# Patient Record
Sex: Male | Born: 1980 | Race: Black or African American | Hispanic: No | Marital: Single | Smoking: Current every day smoker
Health system: Southern US, Community
[De-identification: ages and names within clinical notes are randomized; demographics above are authoritative.]

## PROBLEM LIST (undated history)

## (undated) HISTORY — PX: NO PAST SURGERIES: SHX2092

---

## 2014-03-27 ENCOUNTER — Emergency Department (HOSPITAL_COMMUNITY)
Admission: EM | Admit: 2014-03-27 | Discharge: 2014-03-27 | Disposition: A | Discharge status: Home / Self Care | Payer: No Typology Code available for payment source | Attending: Emergency Medicine | Admitting: Emergency Medicine

## 2014-03-27 ENCOUNTER — Encounter (HOSPITAL_COMMUNITY): Payer: Self-pay | Admitting: Emergency Medicine

## 2014-03-27 ENCOUNTER — Emergency Department (HOSPITAL_COMMUNITY): Payer: No Typology Code available for payment source

## 2014-03-27 DIAGNOSIS — S0990XA Unspecified injury of head, initial encounter: Secondary | ICD-10-CM | POA: Insufficient documentation

## 2014-03-27 DIAGNOSIS — Z720 Tobacco use: Secondary | ICD-10-CM | POA: Insufficient documentation

## 2014-03-27 DIAGNOSIS — S299XXA Unspecified injury of thorax, initial encounter: Secondary | ICD-10-CM | POA: Diagnosis present

## 2014-03-27 DIAGNOSIS — M5489 Other dorsalgia: Secondary | ICD-10-CM

## 2014-03-27 DIAGNOSIS — Y9389 Activity, other specified: Secondary | ICD-10-CM | POA: Insufficient documentation

## 2014-03-27 DIAGNOSIS — Y92410 Unspecified street and highway as the place of occurrence of the external cause: Secondary | ICD-10-CM | POA: Diagnosis not present

## 2014-03-27 MED ORDER — IBUPROFEN 800 MG PO TABS: 800.0000 mg | ORAL_TABLET | Freq: Three times a day (TID) | ORAL | Status: DC

## 2014-03-27 MED ORDER — ACETAMINOPHEN 325 MG PO TABS
650.0000 mg | ORAL_TABLET | Freq: Once | ORAL | Status: AC
  Administered 2014-03-27: 650 mg via ORAL
  Filled 2014-03-27: qty 2
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
## 2017-02-17 ENCOUNTER — Encounter (HOSPITAL_BASED_OUTPATIENT_CLINIC_OR_DEPARTMENT_OTHER): Payer: Self-pay | Admitting: *Deleted

## 2017-02-17 ENCOUNTER — Emergency Department (HOSPITAL_BASED_OUTPATIENT_CLINIC_OR_DEPARTMENT_OTHER)
Admission: EM | Admit: 2017-02-17 | Discharge: 2017-02-18 | Disposition: A | Discharge status: Home / Self Care | Payer: No Typology Code available for payment source | Attending: Emergency Medicine | Admitting: Emergency Medicine

## 2017-02-17 DIAGNOSIS — Y9389 Activity, other specified: Secondary | ICD-10-CM | POA: Diagnosis not present

## 2017-02-17 DIAGNOSIS — Y92410 Unspecified street and highway as the place of occurrence of the external cause: Secondary | ICD-10-CM | POA: Insufficient documentation

## 2017-02-17 DIAGNOSIS — Y998 Other external cause status: Secondary | ICD-10-CM | POA: Diagnosis not present

## 2017-02-17 DIAGNOSIS — M545 Low back pain, unspecified: Secondary | ICD-10-CM

## 2017-02-17 DIAGNOSIS — M436 Torticollis: Secondary | ICD-10-CM | POA: Diagnosis not present

## 2017-02-17 MED ORDER — CYCLOBENZAPRINE HCL 10 MG PO TABS: 10.0000 mg | ORAL_TABLET | Freq: Two times a day (BID) | ORAL | 0 refills | Status: DC | PRN
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
## 2017-08-11 ENCOUNTER — Encounter (HOSPITAL_BASED_OUTPATIENT_CLINIC_OR_DEPARTMENT_OTHER): Payer: Self-pay | Admitting: Emergency Medicine

## 2017-08-11 ENCOUNTER — Emergency Department (HOSPITAL_BASED_OUTPATIENT_CLINIC_OR_DEPARTMENT_OTHER): Payer: BLUE CROSS/BLUE SHIELD

## 2017-08-11 ENCOUNTER — Other Ambulatory Visit: Payer: Self-pay

## 2017-08-11 ENCOUNTER — Emergency Department (HOSPITAL_BASED_OUTPATIENT_CLINIC_OR_DEPARTMENT_OTHER)
Admission: EM | Admit: 2017-08-11 | Discharge: 2017-08-11 | Disposition: A | Discharge status: Home / Self Care | Payer: BLUE CROSS/BLUE SHIELD | Attending: Emergency Medicine | Admitting: Emergency Medicine

## 2017-08-11 DIAGNOSIS — S8391XA Sprain of unspecified site of right knee, initial encounter: Secondary | ICD-10-CM | POA: Diagnosis not present

## 2017-08-11 DIAGNOSIS — Y929 Unspecified place or not applicable: Secondary | ICD-10-CM | POA: Diagnosis not present

## 2017-08-11 DIAGNOSIS — F17210 Nicotine dependence, cigarettes, uncomplicated: Secondary | ICD-10-CM | POA: Insufficient documentation

## 2017-08-11 DIAGNOSIS — S8991XA Unspecified injury of right lower leg, initial encounter: Secondary | ICD-10-CM | POA: Diagnosis present

## 2017-08-11 DIAGNOSIS — Y9367 Activity, basketball: Secondary | ICD-10-CM | POA: Diagnosis not present

## 2017-08-11 DIAGNOSIS — S86911A Strain of unspecified muscle(s) and tendon(s) at lower leg level, right leg, initial encounter: Secondary | ICD-10-CM

## 2017-08-11 DIAGNOSIS — Y998 Other external cause status: Secondary | ICD-10-CM | POA: Diagnosis not present

## 2017-08-11 DIAGNOSIS — Y33XXXA Other specified events, undetermined intent, initial encounter: Secondary | ICD-10-CM | POA: Insufficient documentation

## 2017-08-11 MED ORDER — IBUPROFEN 800 MG PO TABS: 800.0000 mg | ORAL_TABLET | Freq: Three times a day (TID) | ORAL | 0 refills | Status: DC

## 2017-08-11 MED ORDER — IBUPROFEN 800 MG PO TABS
800.0000 mg | ORAL_TABLET | Freq: Once | ORAL | Status: AC
  Administered 2017-08-11: 800 mg via ORAL
  Filled 2017-08-11: qty 1

## 2017-08-11 NOTE — ED Notes (Signed)
ED Provider at bedside. 

## 2017-08-11 NOTE — ED Triage Notes (Signed)
Pain to right knee x2 days since playing basketball with son.  "I came down on it wrong."

## 2017-08-11 NOTE — ED Provider Notes (Signed)
[facility] EMERGENCY DEPARTMENT Provider Note   CSN: [id number] Arrival date & time: 08/11/17  [id number]     History   Chief Complaint Chief Complaint  Patient presents with  . Knee Pain    HPI Benjamin Cameron is a 37 y.o. male.  Pt presents to the ED today with right knee pain.  He said he hurt it 2 days ago while playing basketball with his son.  He is not exactly sure what he did.  He is able to walk on it.  It was hurting last night at work and so he came in this morning when he got off.     History reviewed. No pertinent past medical history.  There are no active problems to display for this patient.   History reviewed. No pertinent surgical history.     Home Medications    Prior to Admission medications   Medication Sig Start Date End Date Taking? Authorizing Provider  cyclobenzaprine (FLEXERIL) 10 MG tablet Take 1 tablet (10 mg total) by mouth 2 (two) times daily as needed for muscle spasms. 02/17/17   Kellie ShropshireShrosbree, Emily J, PA-C  ibuprofen (ADVIL,MOTRIN) 800 MG tablet Take 1 tablet (800 mg total) by mouth 3 (three) times daily. 08/11/17   Jacalyn LefevreHaviland, Jamyiah Labella, MD    Family History No family history on file.  Social History Social History   Tobacco Use  . Smoking status: Current Every Day Smoker    Packs/day: 0.50    Types: Cigarettes  . Smokeless tobacco: Never Used  Substance Use Topics  . Alcohol use: Yes    Comment: occ  . Drug use: No     Allergies   Patient has no known allergies.   Review of Systems Review of Systems  Musculoskeletal:       Knee pain  All other systems reviewed and are negative.    Physical Exam Updated Vital Signs BP (!) 142/89 (BP Location: Right Arm)   Pulse 90   Temp 98.7 F (37.1 C) (Oral)   Resp 16   Ht 6\' 2"  (1.88 m)   Wt 104.3 kg (230 lb)   SpO2 98%   BMI 29.53 kg/m   Physical Exam  Constitutional: He is oriented to person, place, and time. He appears well-developed and well-nourished.  HENT:    Head: Normocephalic and atraumatic.  Right Ear: External ear normal.  Left Ear: External ear normal.  Nose: Nose normal.  Mouth/Throat: Oropharynx is clear and moist.  Eyes: Pupils are equal, round, and reactive to light. Conjunctivae and EOM are normal.  Neck: Normal range of motion. Neck supple.  Cardiovascular: Normal rate, regular rhythm, normal heart sounds and intact distal pulses.  Pulmonary/Chest: Effort normal and breath sounds normal.  Abdominal: Soft. Bowel sounds are normal.  Musculoskeletal:       Right knee: He exhibits swelling. Tenderness found. Lateral joint line tenderness noted.  Neurological: He is alert and oriented to person, place, and time.  Skin: Skin is warm. Capillary refill takes less than 2 seconds.  Psychiatric: He has a normal mood and affect. His behavior is normal. Judgment and thought content normal.  Nursing note and vitals reviewed.    ED Treatments / Results  Labs (all labs ordered are listed, but only abnormal results are displayed) Labs Reviewed - No data to display  EKG  EKG Interpretation None       Radiology Dg Knee Complete 4 Views Right  Result Date: 08/11/2017 CLINICAL DATA:  Lateral knee pain following playing  basketball, initial encounter EXAM: RIGHT KNEE - COMPLETE 4+ VIEW COMPARISON:  None. FINDINGS: No evidence of fracture, dislocation, or joint effusion. No evidence of arthropathy or other focal bone abnormality. Soft tissues are unremarkable. IMPRESSION: No acute abnormality noted. Electronically Signed   By: Alcide Clever M.D.   On: 08/11/2017 08:20    Procedures Procedures (including critical care time)  Medications Ordered in ED Medications  ibuprofen (ADVIL,MOTRIN) tablet 800 mg (800 mg Oral Given 08/11/17 0745)     Initial Impression / Assessment and Plan / ED Course  I have reviewed the triage vital signs and the nursing notes.  Pertinent labs & imaging results that were available during my care of the patient  were reviewed by me and considered in my medical decision making (see chart for details).     Knee xray ok.  Pt instructed to f/u with sports medicine.  Return if worse.    Final Clinical Impressions(s) / ED Diagnoses   Final diagnoses:  Strain of right knee, initial encounter    ED Discharge Orders        Ordered    ibuprofen (ADVIL,MOTRIN) 800 MG tablet  3 times daily     08/11/17 1610       Jacalyn Lefevre, MD 08/11/17 [phone]

## 2017-08-11 NOTE — ED Notes (Signed)
Patient transported to X-ray 

## 2017-08-11 NOTE — ED Notes (Signed)
ED Provider at bedside discussing test results and dispo plan of care. 

## 2018-02-28 ENCOUNTER — Emergency Department (HOSPITAL_BASED_OUTPATIENT_CLINIC_OR_DEPARTMENT_OTHER)
Admission: EM | Admit: 2018-02-28 | Discharge: 2018-02-28 | Disposition: A | Discharge status: Home / Self Care | Payer: BLUE CROSS/BLUE SHIELD | Attending: Emergency Medicine | Admitting: Emergency Medicine

## 2018-02-28 ENCOUNTER — Emergency Department (HOSPITAL_BASED_OUTPATIENT_CLINIC_OR_DEPARTMENT_OTHER): Payer: BLUE CROSS/BLUE SHIELD

## 2018-02-28 ENCOUNTER — Other Ambulatory Visit: Payer: Self-pay

## 2018-02-28 ENCOUNTER — Encounter (HOSPITAL_BASED_OUTPATIENT_CLINIC_OR_DEPARTMENT_OTHER): Payer: Self-pay

## 2018-02-28 DIAGNOSIS — K921 Melena: Secondary | ICD-10-CM | POA: Insufficient documentation

## 2018-02-28 DIAGNOSIS — R0789 Other chest pain: Secondary | ICD-10-CM | POA: Diagnosis present

## 2018-02-28 DIAGNOSIS — F17210 Nicotine dependence, cigarettes, uncomplicated: Secondary | ICD-10-CM | POA: Insufficient documentation

## 2018-02-28 LAB — CBC WITH DIFFERENTIAL/PLATELET
ABS IMMATURE GRANULOCYTES: 0 10*3/uL (ref 0.00–0.07)
Basophils Absolute: 0 10*3/uL (ref 0.0–0.1)
Basophils Relative: 1 %
Eosinophils Absolute: 0.3 10*3/uL (ref 0.0–0.5)
Eosinophils Relative: 7 %
HCT: 41.2 % (ref 39.0–52.0)
Hemoglobin: 13.5 g/dL (ref 13.0–17.0)
Immature Granulocytes: 0 %
Lymphocytes Relative: 35 %
Lymphs Abs: 1.8 10*3/uL (ref 0.7–4.0)
MCH: 30.6 pg (ref 26.0–34.0)
MCHC: 32.8 g/dL (ref 30.0–36.0)
MCV: 93.4 fL (ref 80.0–100.0)
MONO ABS: 0.6 10*3/uL (ref 0.1–1.0)
MONOS PCT: 13 %
NEUTROS ABS: 2.3 10*3/uL (ref 1.7–7.7)
Neutrophils Relative %: 44 %
Platelets: 264 10*3/uL (ref 150–400)
RBC: 4.41 MIL/uL (ref 4.22–5.81)
RDW: 13.5 % (ref 11.5–15.5)
WBC: 5.1 10*3/uL (ref 4.0–10.5)
nRBC: 0 % (ref 0.0–0.2)

## 2018-02-28 LAB — COMPREHENSIVE METABOLIC PANEL
ALBUMIN: 4.3 g/dL (ref 3.5–5.0)
ALT: 41 U/L (ref 0–44)
AST: 36 U/L (ref 15–41)
Alkaline Phosphatase: 67 U/L (ref 38–126)
Anion gap: 8 (ref 5–15)
BUN: 10 mg/dL (ref 6–20)
CHLORIDE: 103 mmol/L (ref 98–111)
CO2: 27 mmol/L (ref 22–32)
Calcium: 9.4 mg/dL (ref 8.9–10.3)
Creatinine, Ser: 1.02 mg/dL (ref 0.61–1.24)
GFR calc Af Amer: >60 mL/min (ref >60)
GFR calc non Af Amer: >60 mL/min (ref >60)
Glucose, Bld: 88 mg/dL (ref 70–99)
Potassium: 3.9 mmol/L (ref 3.5–5.1)
Sodium: 138 mmol/L (ref 135–145)
Total Bilirubin: 0.8 mg/dL (ref 0.3–1.2)
Total Protein: 7.7 g/dL (ref 6.5–8.1)

## 2018-02-28 LAB — TROPONIN I: Troponin I: <0.03 ng/mL (ref <0.03)

## 2018-02-28 NOTE — ED Triage Notes (Signed)
C/o CP x month, bloody stools x 2-3 weeks-has not sought medical attention for either c/o-NAD-steady gait

## 2018-02-28 NOTE — ED Provider Notes (Signed)
[facility] EMERGENCY DEPARTMENT Provider Note   CSN: [id number] Arrival date & time: 02/28/18  1415     History   Chief Complaint Chief Complaint  Patient presents with  . Chest Pain    HPI Benjamin Cameron is a 37 y.o. male.  The history is provided by the patient. No language interpreter was used.  Chest Pain     Benjamin Cameron is a 37 y.o. male who presents to the Emergency Department complaining of chest tightness, rectal bleeding. He presents to the emergency department for evaluation of two complaints. He reports chest tightness that is been present for the last few weeks. Symptoms were initially intermittent in nature and now they are constant. There are no clear alleviating or worsening factors. He denies any fevers, shortness of breath, abdominal pain, nausea, vomiting. He does have associated lightheadedness intermittently over the last few weeks. Lightheadedness comes and goes with no clear alleviating or worsening factors. He also complains of bright red blood per rectum for the last several weeks. He has 1 to 2 bowel movements daily with bright red blood and occasional clots mixed with stool. He has no pain with bowel movements. No prior similar symptoms. He has no medical problems and takes no medications. He smokes tobacco. Occasional alcohol use, no street drugs. He is a family history of hypertension, diabetes and cardiac disease. No history of bleeding disorders. He does not have a PCP. History reviewed. No pertinent past medical history.  There are no active problems to display for this patient.   History reviewed. No pertinent surgical history.      Home Medications    Prior to Admission medications   Not on File    Family History No family history on file.  Social History Social History   Tobacco Use  . Smoking status: Current Every Day Smoker    Packs/day: 0.50    Types: Cigarettes  . Smokeless tobacco: Never Used  Substance Use  Topics  . Alcohol use: Yes    Comment: occ  . Drug use: No     Allergies   Patient has no known allergies.   Review of Systems Review of Systems  Cardiovascular: Positive for chest pain.  All other systems reviewed and are negative.    Physical Exam Updated Vital Signs BP (!) 146/105 (BP Location: Left Arm)   Pulse 82   Temp 98.5 F (36.9 C) (Oral)   Resp 18   SpO2 100%   Physical Exam  Constitutional: He is oriented to person, place, and time. He appears well-developed and well-nourished.  HENT:  Head: Normocephalic and atraumatic.  Cardiovascular: Normal rate and regular rhythm.  No murmur heard. Pulmonary/Chest: Effort normal and breath sounds normal. No respiratory distress.  Abdominal: Soft. There is no tenderness. There is no rebound and no guarding.  Genitourinary:  Genitourinary Comments: Rectal exam with no external hemorrhoids. No palpable masses on DRE. No gross blood.  Musculoskeletal: He exhibits no edema or tenderness.  Neurological: He is alert and oriented to person, place, and time.  Skin: Skin is warm and dry.  Psychiatric: He has a normal mood and affect. His behavior is normal.  Nursing note and vitals reviewed.    ED Treatments / Results  Labs (all labs ordered are listed, but only abnormal results are displayed) Labs Reviewed  COMPREHENSIVE METABOLIC PANEL  CBC WITH DIFFERENTIAL/PLATELET  TROPONIN I    EKG EKG Interpretation  Date/Time:  Wednesday February 28 2018 14:27:48 EDT Ventricular Rate:  72 PR Interval:  174 QRS Duration: 96 QT Interval:  376 QTC Calculation: 411 R Axis:   25 Text Interpretation:  Normal sinus rhythm Normal ECG No prior ECG for comparison.  Appears to show early repol in lead 1,AVL and V2. No STEMI Confirmed by Theda Belfast ([id number]) on 02/28/2018 2:32:40 PM   Radiology Dg Chest 2 View  Result Date: 02/28/2018 CLINICAL DATA:  Chest tightness EXAM: CHEST - 2 VIEW COMPARISON:  None. FINDINGS: There is  atelectatic change in the left base. Lungs elsewhere are clear. Heart size and pulmonary vascularity are normal. No adenopathy. No bone lesions. No pneumothorax. IMPRESSION: Atelectasis left base.  Lungs elsewhere clear. Electronically Signed   By: Bretta Bang III M.D.   On: 02/28/2018 16:03    Procedures Procedures (including critical care time)  Medications Ordered in ED Medications - No data to display   Initial Impression / Assessment and Plan / ED Course  I have reviewed the triage vital signs and the nursing notes.  Pertinent labs & imaging results that were available during my care of the patient were reviewed by me and considered in my medical decision making (see chart for details).     Pt here for evaluation of chest pain and hematochezia. He is well appearing on examination and in no acute distress. No gross blood on examination in the ED. CBC within normal limits. Sensation is not consistent with G.I. hemorrhage, ACS, PE, dissection, pneumonia. Discussed with patient home care for chest tightness as well as hematochezia. Discussed importance of PCP as well as G.I. follow-up. Return precautions discussed.  Final Clinical Impressions(s) / ED Diagnoses   Final diagnoses:  Atypical chest pain  Hematochezia    ED Discharge Orders    None       Tilden Fossa, MD 02/28/18 1645

## 2019-03-12 IMAGING — CR DG CHEST 2V
2 series · 2 of 2 positions shown · non-contrast
Comparison: None.

CLINICAL DATA: Chest tightness

EXAM:
CHEST - 2 VIEW

[w chest pa]
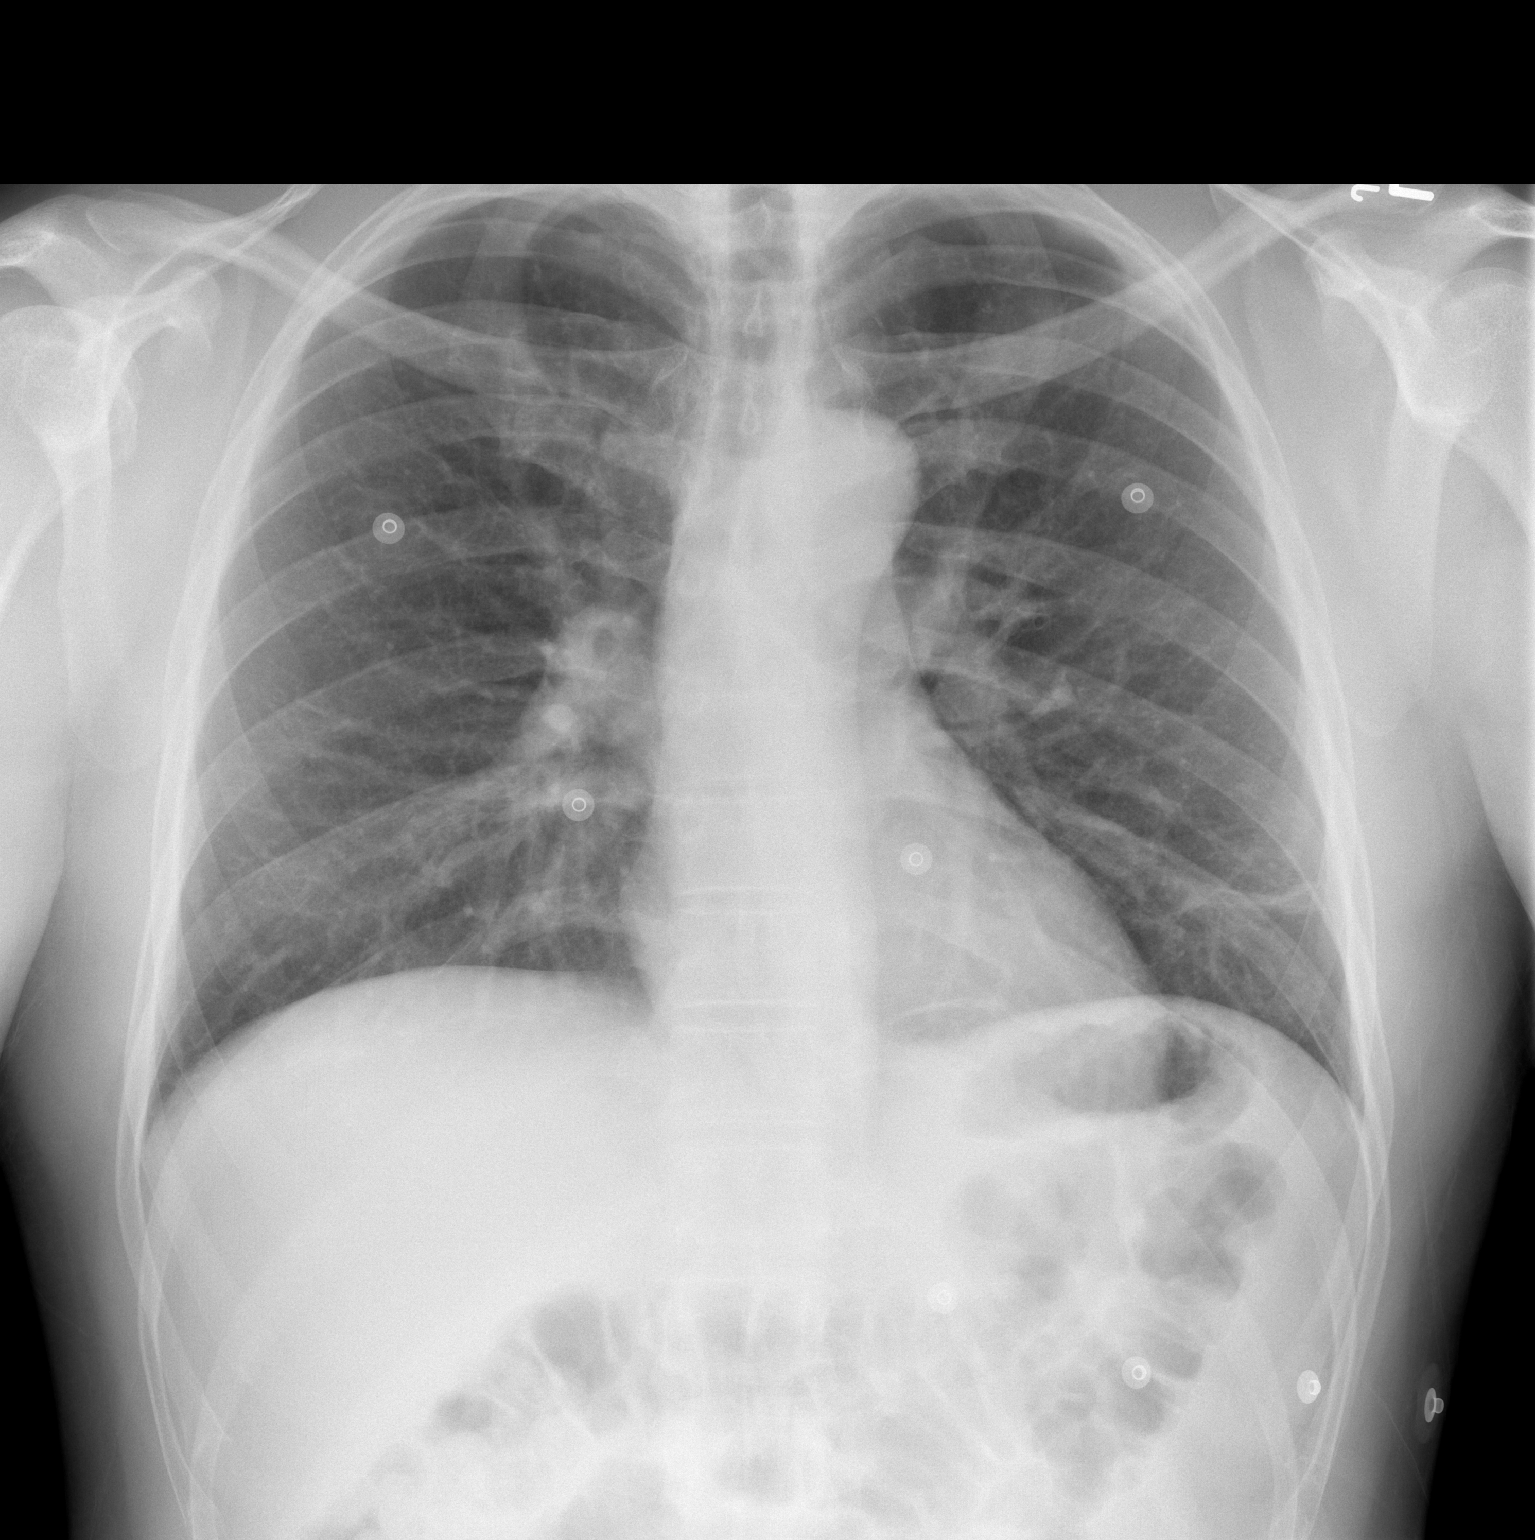

[w chest lat]
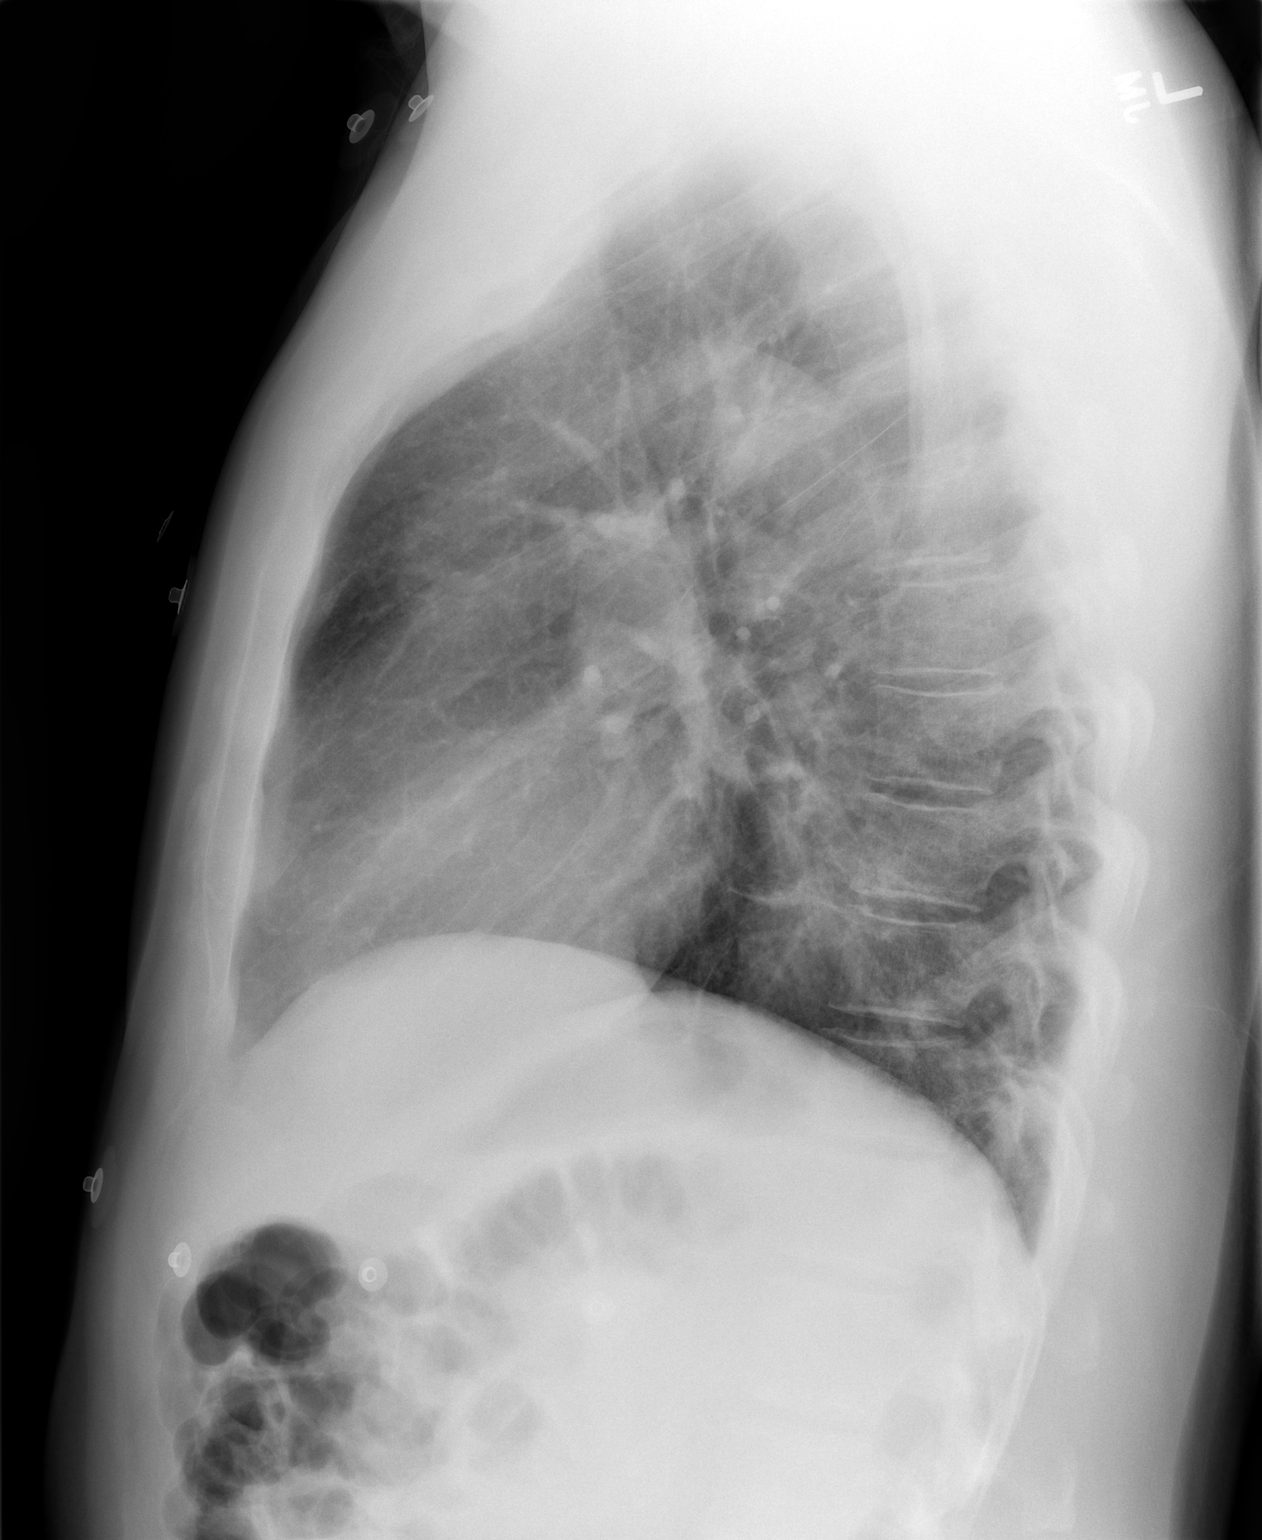

[2 of 2 positions shown; findings below may reference images not displayed]

FINDINGS: There is atelectatic change in the left base. Lungs elsewhere are
clear. Heart size and pulmonary vascularity are normal. No
adenopathy. No bone lesions. No pneumothorax.
IMPRESSION: Atelectasis left base.  Lungs elsewhere clear.

## 2020-05-25 ENCOUNTER — Emergency Department (HOSPITAL_COMMUNITY)
Admission: EM | Admit: 2020-05-25 | Discharge: 2020-05-25 | Disposition: A | Discharge status: Home / Self Care | Payer: Worker's Compensation | Attending: Emergency Medicine | Admitting: Emergency Medicine

## 2020-05-25 ENCOUNTER — Encounter (HOSPITAL_COMMUNITY): Payer: Self-pay

## 2020-05-25 ENCOUNTER — Emergency Department (HOSPITAL_COMMUNITY): Payer: Worker's Compensation

## 2020-05-25 ENCOUNTER — Other Ambulatory Visit: Payer: Self-pay

## 2020-05-25 DIAGNOSIS — F17210 Nicotine dependence, cigarettes, uncomplicated: Secondary | ICD-10-CM | POA: Insufficient documentation

## 2020-05-25 DIAGNOSIS — W19XXXA Unspecified fall, initial encounter: Secondary | ICD-10-CM | POA: Insufficient documentation

## 2020-05-25 DIAGNOSIS — S99912A Unspecified injury of left ankle, initial encounter: Secondary | ICD-10-CM | POA: Diagnosis present

## 2020-05-25 DIAGNOSIS — S82832A Other fracture of upper and lower end of left fibula, initial encounter for closed fracture: Secondary | ICD-10-CM | POA: Insufficient documentation

## 2020-05-25 DIAGNOSIS — Y990 Civilian activity done for income or pay: Secondary | ICD-10-CM | POA: Insufficient documentation

## 2020-05-25 NOTE — Discharge Instructions (Signed)
Use Tylenol, ice and ibuprofen as needed for pain. Follow-up with orthopedics for further recommendations.

## 2020-05-25 NOTE — ED Provider Notes (Signed)
[facility] EMERGENCY DEPARTMENT Provider Note   CSN: [id number] Arrival date & time: 05/25/20  1802     History Chief Complaint  Patient presents with  . Leg Injury    Benjamin Cameron is a 40 y.o. male.  Patient presents from urgent care for further evaluation treatment of left ankle injury.  Patient was at work and had an event that caused him to fall back injuring his left ankle.  Pain and swelling with walking.  No other injuries.  No weakness or numbness.        History reviewed. No pertinent past medical history.  There are no problems to display for this patient.   History reviewed. No pertinent surgical history.     History reviewed. No pertinent family history.  Social History   Tobacco Use  . Smoking status: Current Every Day Smoker    Packs/day: 0.50    Types: Cigarettes  . Smokeless tobacco: Never Used  Substance Use Topics  . Alcohol use: Yes    Comment: occ  . Drug use: No    Home Medications Prior to Admission medications   Not on File    Allergies    Patient has no known allergies.  Review of Systems   Review of Systems  Respiratory: Negative for shortness of breath.   Cardiovascular: Negative for chest pain.  Gastrointestinal: Negative for abdominal pain and vomiting.  Genitourinary: Negative for flank pain.  Musculoskeletal: Positive for gait problem and joint swelling. Negative for back pain, neck pain and neck stiffness.  Skin: Negative for rash.  Neurological: Negative for light-headedness and headaches.    Physical Exam Updated Vital Signs BP (!) 145/101 (BP Location: Right Arm)   Pulse 99   Temp 99.8 F (37.7 C) (Oral)   Resp 16   SpO2 100%   Physical Exam Vitals and nursing note reviewed.  Constitutional:      Appearance: He is well-developed and well-nourished.  HENT:     Head: Normocephalic and atraumatic.  Eyes:     General:        Right eye: No discharge.        Left eye: No discharge.      Conjunctiva/sclera: Conjunctivae normal.  Neck:     Trachea: No tracheal deviation.  Cardiovascular:     Rate and Rhythm: Normal rate and regular rhythm.     Heart sounds: No murmur heard.   Pulmonary:     Effort: Pulmonary effort is normal. No respiratory distress.     Breath sounds: Normal breath sounds.  Abdominal:     General: There is no distension.     Palpations: Abdomen is soft.     Tenderness: There is no abdominal tenderness. There is no guarding.  Musculoskeletal:        General: Swelling, tenderness and signs of injury present. No deformity or edema.     Cervical back: Normal range of motion and neck supple.     Comments: Patient has tenderness and swelling lateral malleolus on the left ankle, no deformity, no tenderness to left foot or proximal left leg or knee or hip.  Compartment soft, neurovascularly intact.  Skin:    General: Skin is warm and dry.     Findings: No rash.  Neurological:     Mental Status: He is alert and oriented to person, place, and time.  Psychiatric:        Mood and Affect: Mood and affect normal.     ED Results /  Procedures / Treatments   Labs (all labs ordered are listed, but only abnormal results are displayed) Labs Reviewed - No data to display  EKG None  Radiology DG Ankle Complete Left  Result Date: 05/25/2020 CLINICAL DATA:  Injury or work this morning. Reported distal fibular fracture. EXAM: LEFT ANKLE COMPLETE - 3+ VIEW COMPARISON:  None. FINDINGS: There is a fracture from the inferior margin of the distal fibula, without significant displacement. There is also a small fracture from the lateral margin of the distal fibula below the level of the ankle joint. No other defined fractures. The ankle joint is normally spaced and aligned. There is significant soft tissue swelling predominantly laterally. IMPRESSION: 1. Avulsion type fractures from the distal fibula as detailed. Normally aligned ankle joint. Electronically Signed   By:  Amie Portland M.D.   On: 05/25/2020 19:36    Procedures Procedures (including critical care time)  Medications Ordered in ED Medications - No data to display  ED Course  I have reviewed the triage vital signs and the nursing notes.  Pertinent labs & imaging results that were available during my care of the patient were reviewed by me and considered in my medical decision making (see chart for details).    MDM Rules/Calculators/A&P                          Patient presents after injury at work.  Reviewed x-ray results showing distal left fibula fracture comminuted however unable to visualize images. X-ray ordered to ensure mortise intact, x-rays reviewed in the ER showing avulsion type fracture distal fibula with normally aligned joint. Discussed with orthopedic technician for Cam walker boot and outpatient follow-up discussed with orthopedics.  Final Clinical Impression(s) / ED Diagnoses Final diagnoses:  Closed avulsion fracture of distal end of left fibula, initial encounter    Rx / DC Orders ED Discharge Orders    None       Blane Ohara, MD 05/25/20 2009

## 2020-05-25 NOTE — Progress Notes (Signed)
Orthopedic Tech Progress Note Patient Details:  Benjamin Cameron 1980-12-24 [id number]  Ortho Devices Type of Ortho Device: CAM walker Ortho Device/Splint Location: lle Ortho Device/Splint Interventions: Ordered,Application,Adjustment   Post Interventions Patient Tolerated: Well Instructions Provided: Care of device,Adjustment of device   Trinna Post 05/25/2020, 8:47 PM

## 2020-05-25 NOTE — ED Triage Notes (Signed)
Pt seen at [facility] for a work related Left leg injury approx 10 am. Recent XR shows Left distal fibula fx, comminuted displaced fx.   They sent him here for further treatment.

## 2021-06-06 IMAGING — DX DG ANKLE COMPLETE 3+V*L*
3 series · 3 of 3 positions shown · non-contrast
Comparison: None.

CLINICAL DATA: Injury or work this morning. Reported distal fibular
fracture.

EXAM:
LEFT ANKLE COMPLETE - 3+ VIEW

[ankle ap]
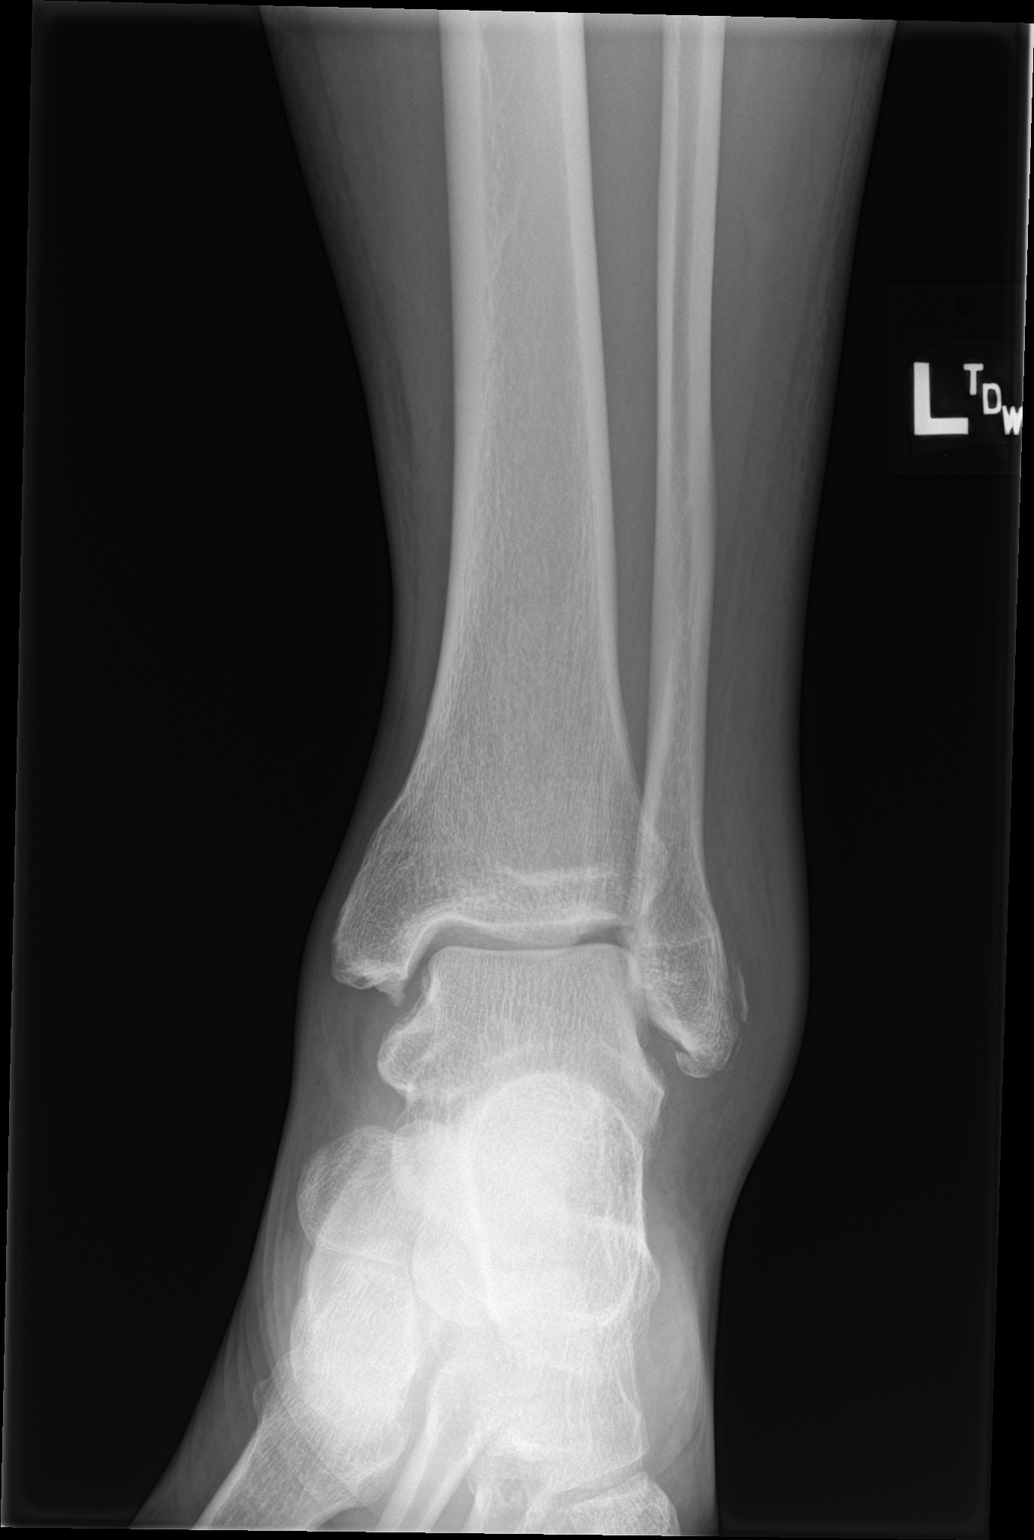

[ankle obl]
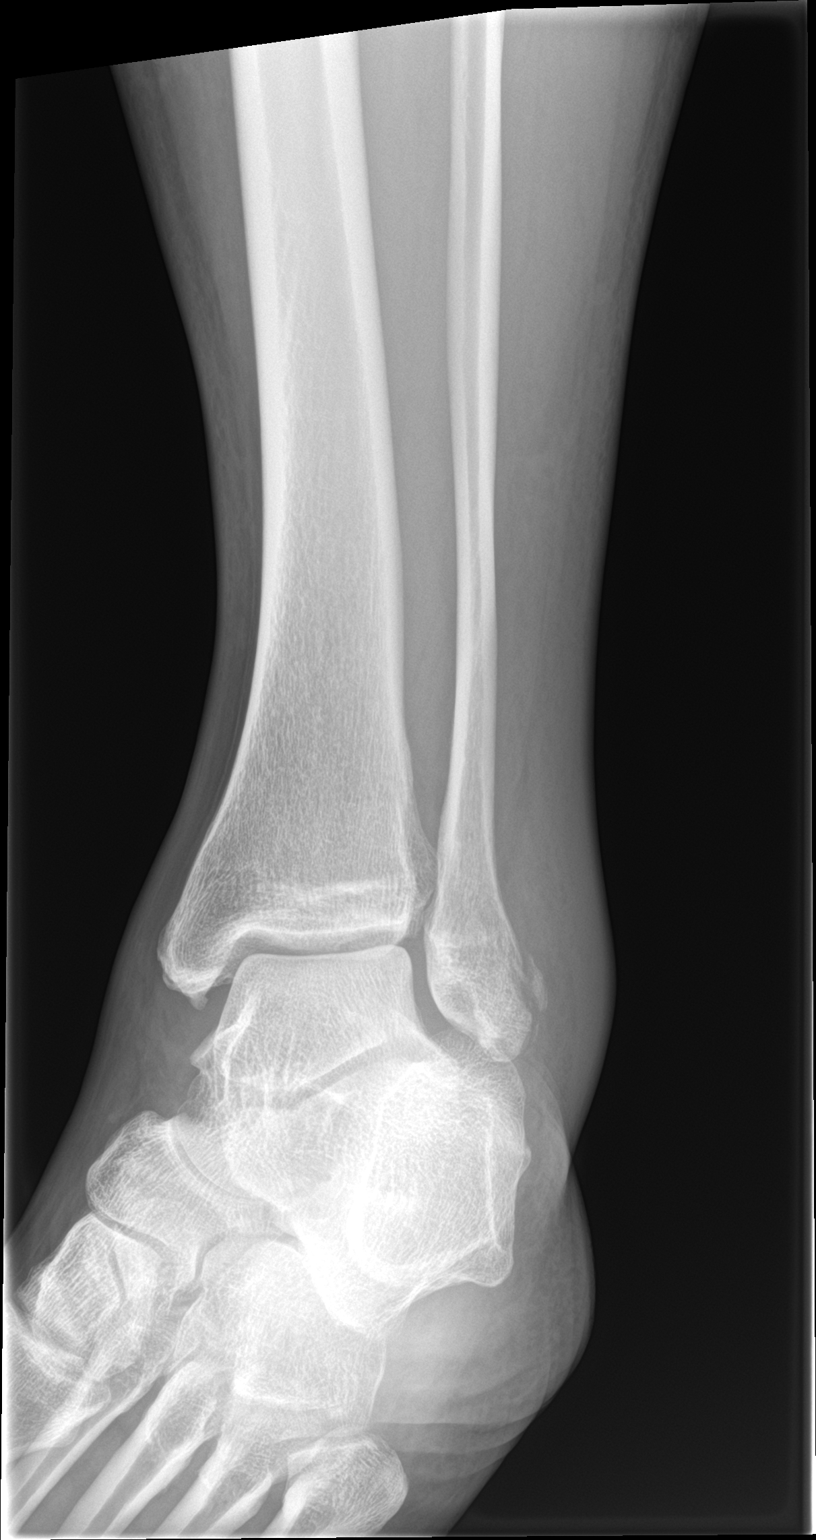

[ankle lat]
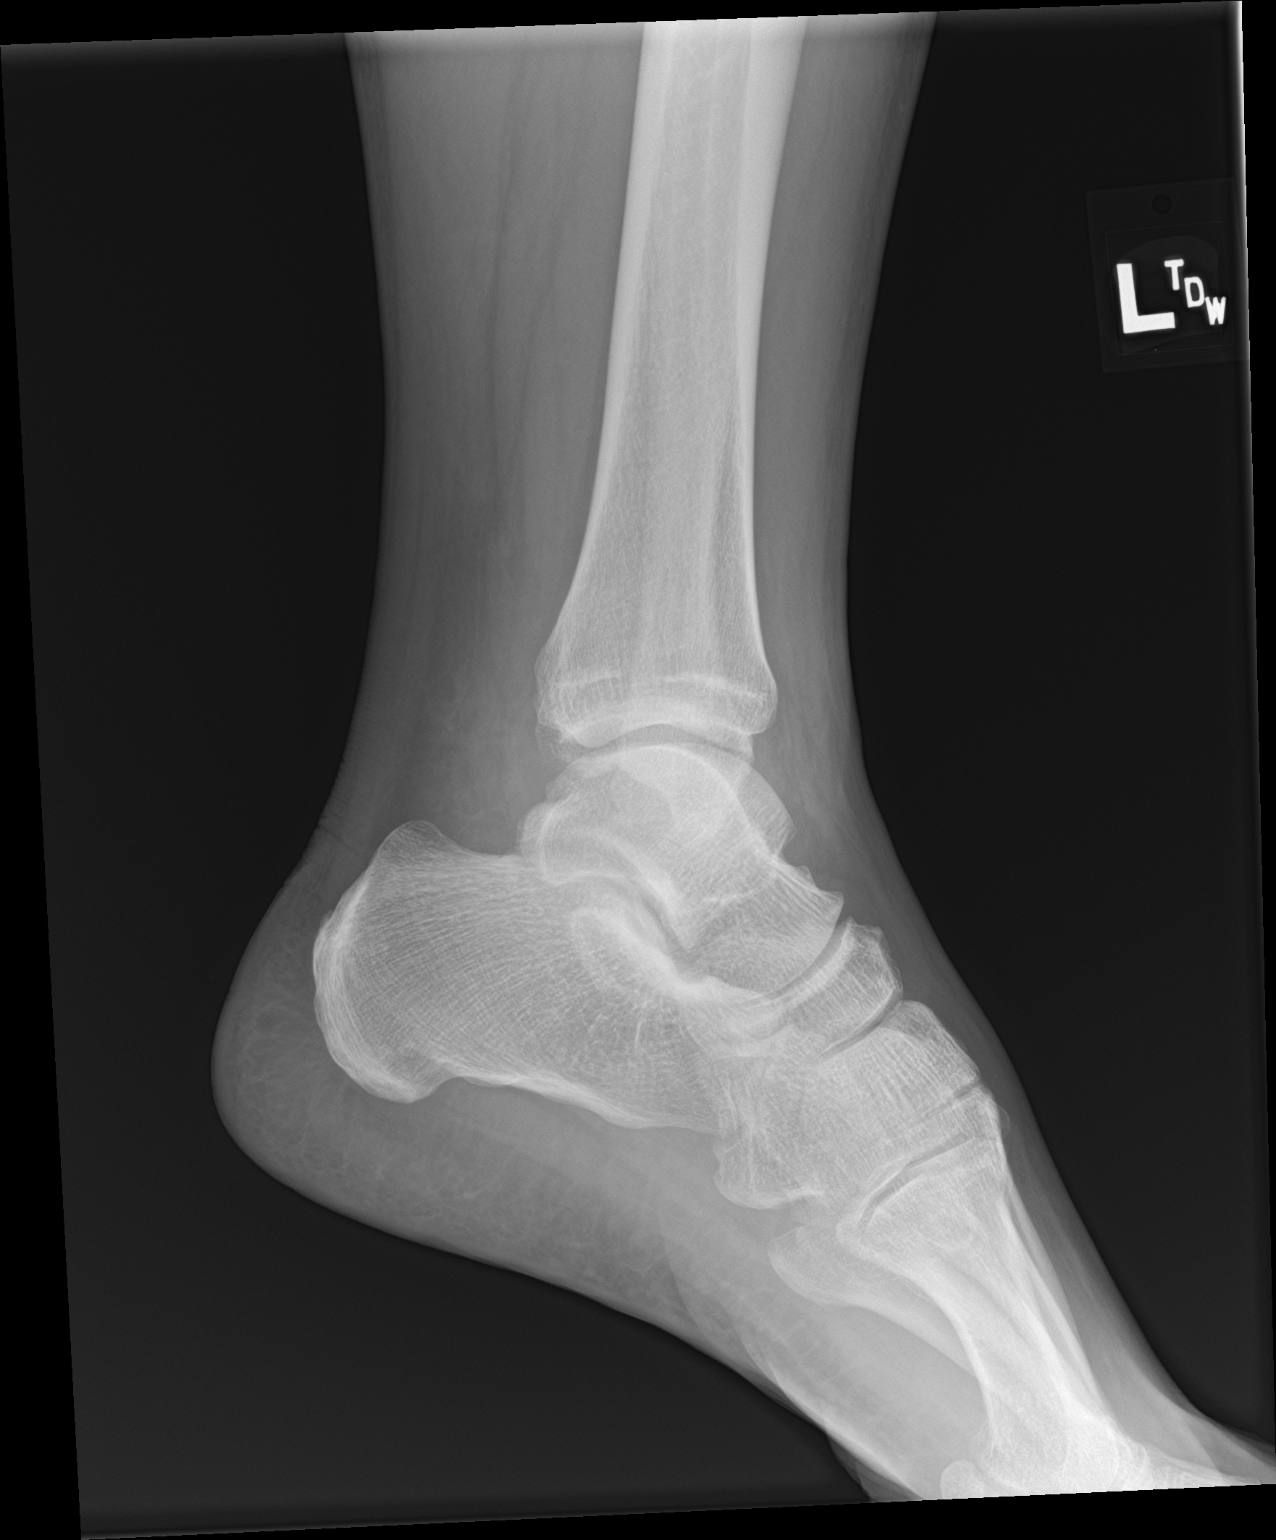

[3 of 3 positions shown; findings below may reference images not displayed]

FINDINGS: There is a fracture from the inferior margin of the distal fibula,
without significant displacement. There is also a small fracture
from the lateral margin of the distal fibula below the level of the
ankle joint.

No other defined fractures.

The ankle joint is normally spaced and aligned.

There is significant soft tissue swelling predominantly laterally.
IMPRESSION: 1. Avulsion type fractures from the distal fibula as detailed.
Normally aligned ankle joint.

## 2022-01-19 ENCOUNTER — Encounter (HOSPITAL_COMMUNITY): Payer: Self-pay

## 2022-01-19 ENCOUNTER — Emergency Department (HOSPITAL_COMMUNITY): Payer: BC Managed Care – PPO

## 2022-01-19 ENCOUNTER — Emergency Department (HOSPITAL_COMMUNITY)
Admission: EM | Admit: 2022-01-19 | Discharge: 2022-01-19 | Disposition: A | Discharge status: Home / Self Care | Payer: BC Managed Care – PPO | Attending: Emergency Medicine | Admitting: Emergency Medicine

## 2022-01-19 DIAGNOSIS — F17210 Nicotine dependence, cigarettes, uncomplicated: Secondary | ICD-10-CM | POA: Insufficient documentation

## 2022-01-19 DIAGNOSIS — Z87891 Personal history of nicotine dependence: Secondary | ICD-10-CM

## 2022-01-19 DIAGNOSIS — R519 Headache, unspecified: Secondary | ICD-10-CM | POA: Diagnosis present

## 2022-01-19 DIAGNOSIS — I10 Essential (primary) hypertension: Secondary | ICD-10-CM | POA: Diagnosis not present

## 2022-01-19 DIAGNOSIS — Z79899 Other long term (current) drug therapy: Secondary | ICD-10-CM | POA: Diagnosis not present

## 2022-01-19 LAB — CBC WITH DIFFERENTIAL/PLATELET
Abs Immature Granulocytes: 0.01 10*3/uL (ref 0.00–0.07)
Basophils Absolute: 0.1 10*3/uL (ref 0.0–0.1)
Basophils Relative: 1 %
Eosinophils Absolute: 0.3 10*3/uL (ref 0.0–0.5)
Eosinophils Relative: 6 %
HCT: 42.7 % (ref 39.0–52.0)
Hemoglobin: 14.1 g/dL (ref 13.0–17.0)
Immature Granulocytes: 0 %
Lymphocytes Relative: 29 %
Lymphs Abs: 1.5 10*3/uL (ref 0.7–4.0)
MCH: 30.9 pg (ref 26.0–34.0)
MCHC: 33 g/dL (ref 30.0–36.0)
MCV: 93.6 fL (ref 80.0–100.0)
Monocytes Absolute: 0.6 10*3/uL (ref 0.1–1.0)
Monocytes Relative: 11 %
Neutro Abs: 2.7 10*3/uL (ref 1.7–7.7)
Neutrophils Relative %: 53 %
Platelets: 269 10*3/uL (ref 150–400)
RBC: 4.56 MIL/uL (ref 4.22–5.81)
RDW: 13.5 % (ref 11.5–15.5)
WBC: 5.3 10*3/uL (ref 4.0–10.5)
nRBC: 0 % (ref 0.0–0.2)

## 2022-01-19 LAB — COMPREHENSIVE METABOLIC PANEL
ALT: 29 U/L (ref 0–44)
AST: 29 U/L (ref 15–41)
Albumin: 4.5 g/dL (ref 3.5–5.0)
Alkaline Phosphatase: 54 U/L (ref 38–126)
Anion gap: 9 (ref 5–15)
BUN: 10 mg/dL (ref 6–20)
CO2: 26 mmol/L (ref 22–32)
Calcium: 9.7 mg/dL (ref 8.9–10.3)
Chloride: 110 mmol/L (ref 98–111)
Creatinine, Ser: 1.01 mg/dL (ref 0.61–1.24)
GFR, Estimated: >60 mL/min (ref >60)
Glucose, Bld: 87 mg/dL (ref 70–99)
Potassium: 4.1 mmol/L (ref 3.5–5.1)
Sodium: 145 mmol/L (ref 135–145)
Total Bilirubin: 1 mg/dL (ref 0.3–1.2)
Total Protein: 7.8 g/dL (ref 6.5–8.1)

## 2022-01-19 MED ORDER — AMLODIPINE BESYLATE 5 MG PO TABS
5.0000 mg | ORAL_TABLET | Freq: Every day | ORAL | 1 refills | Status: DC
  Filled 2022-01-19: qty 60, 60d supply, fill #0

## 2022-01-19 MED ORDER — AMLODIPINE BESYLATE 5 MG PO TABS
5.0000 mg | ORAL_TABLET | Freq: Once | ORAL | Status: AC
  Administered 2022-01-19: 5 mg via ORAL
  Filled 2022-01-19: qty 1

## 2022-01-19 MED ORDER — ACETAMINOPHEN 500 MG PO TABS
1000.0000 mg | ORAL_TABLET | Freq: Once | ORAL | Status: AC
  Administered 2022-01-19: 1000 mg via ORAL
  Filled 2022-01-19: qty 2

## 2022-01-19 NOTE — ED Provider Triage Note (Signed)
Emergency Medicine Provider Triage Evaluation Note  Benjamin Cameron , a 41 y.o. male  was evaluated in triage.  Pt complains of headache.  States over the last week he has had intermittent sudden onset headaches.  He says the one today started approximately 930 out of nowhere.  He has had associated neck pain without any other neurological symptoms. Also states he has measured blood pressure over the past week and has found it to be higher than normal.  He says his systolic has been anywhere from the 150s to the 170s.  He states he has not normally checked his blood pressure recently, but he thinks this is very high for him.  Review of Systems  Positive:  Negative:   Physical Exam  BP (!) 167/112 (BP Location: Right Arm)   Pulse 77   Temp 98.2 F (36.8 C) (Oral)   Resp 18   Ht 6\' 2"  (1.88 m)   Wt 106.6 kg   SpO2 100%   BMI 30.17 kg/m  Gen:   Awake, no distress   Resp:  Normal effort  MSK:   Moves extremities without difficulty  Other:    Medical Decision Making  Medically screening exam initiated at 1:38 PM.  Appropriate orders placed.  Benjamin Cameron was informed that the remainder of the evaluation will be completed by another provider, this initial triage assessment does not replace that evaluation, and the importance of remaining in the ED until their evaluation is complete.     [name], PA-C 01/19/22 1340

## 2022-01-19 NOTE — ED Triage Notes (Signed)
Pt arrived via POV, c/o headache and HTN x1 week. No hx of such.

## 2022-01-19 NOTE — ED Provider Notes (Signed)
[facility]-EMERGENCY DEPT Provider Note  CSN: [id number] Arrival date & time: 01/19/22 1225  Chief Complaint(s) Hypertension and Headache  HPI Benjamin Cameron is a 41 y.o. male with no past medical history presenting to the emergency department with headache.  Patient reports headaches intermittently for the past week, come and go.  He has also checked his blood pressure over the past week and it is elevated.  He denies any chest pain, abdominal pain, vision changes, fevers or chills.  Headaches are mild.  They are sometimes gradual in onset and sometimes start suddenly.  He reports occasional neck pain.  Denies history of high blood pressure.  Denies nausea, vomiting.  Denies syncope.  Denies head trauma.   Past Medical History History reviewed. No pertinent past medical history. There are no problems to display for this patient.  Home Medication(s) Prior to Admission medications   Medication Sig Start Date End Date Taking? Authorizing Provider  amLODipine (NORVASC) 5 MG tablet Take 1 tablet (5 mg total) by mouth daily. 01/19/22  Yes Lonell Grandchild, MD                                                                                                                                    Past Surgical History History reviewed. No pertinent surgical history. Family History History reviewed. No pertinent family history.  Social History Social History   Tobacco Use   Smoking status: Every Day    Packs/day: 0.50    Types: Cigarettes   Smokeless tobacco: Never  Substance Use Topics   Alcohol use: Yes    Comment: occ   Drug use: No   Allergies Patient has no known allergies.  Review of Systems Review of Systems  All other systems reviewed and are negative.   Physical Exam Vital Signs  I have reviewed the triage vital signs BP (!) 182/127 (BP Location: Left Arm)   Pulse 76   Temp 97.9 F (36.6 C) (Oral)   Resp 18   Ht 6\' 2"  (1.88 m)   Wt 106.6 kg    SpO2 99%   BMI 30.17 kg/m  Physical Exam Vitals and nursing note reviewed.  Constitutional:      General: He is not in acute distress.    Appearance: Normal appearance.  HENT:     Mouth/Throat:     Mouth: Mucous membranes are moist.  Eyes:     Conjunctiva/sclera: Conjunctivae normal.  Cardiovascular:     Rate and Rhythm: Normal rate and regular rhythm.  Pulmonary:     Effort: Pulmonary effort is normal. No respiratory distress.     Breath sounds: Normal breath sounds.  Abdominal:     General: Abdomen is flat.     Palpations: Abdomen is soft.     Tenderness: There is no abdominal tenderness.  Musculoskeletal:     Right lower leg: No edema.     Left lower leg: No  edema.  Skin:    General: Skin is warm and dry.     Capillary Refill: Capillary refill takes less than 2 seconds.  Neurological:     Mental Status: He is alert and oriented to person, place, and time. Mental status is at baseline.     Comments: Cranial nerves II through XII intact, strength 5 out of 5 in the bilateral upper and lower extremities, no sensory deficit to light touch, no dysmetria on finger-nose-finger testing, ambulatory with steady gait.   Psychiatric:        Mood and Affect: Mood normal.        Behavior: Behavior normal.     ED Results and Treatments Labs (all labs ordered are listed, but only abnormal results are displayed) Labs Reviewed  CBC WITH DIFFERENTIAL/PLATELET  COMPREHENSIVE METABOLIC PANEL                                                                                                                          Radiology CT Head Wo Contrast  Result Date: 01/19/2022 CLINICAL DATA:  Sudden severe occipital headache for 1 week, generalized weakness EXAM: CT HEAD WITHOUT CONTRAST TECHNIQUE: Contiguous axial images were obtained from the base of the skull through the vertex without intravenous contrast. RADIATION DOSE REDUCTION: This exam was performed according to the departmental  dose-optimization program which includes automated exposure control, adjustment of the mA and/or kV according to patient size and/or use of iterative reconstruction technique. COMPARISON:  None Available. FINDINGS: Brain: Normal ventricular morphology. No midline shift or mass effect. Normal appearance of brain parenchyma. No intracranial hemorrhage, mass lesion, or evidence of acute infarction. No extra-axial fluid collections. Vascular: No hyperdense vessels. Skull: Intact Sinuses/Orbits: Mucosal retention cyst RIGHT maxillary sinus. Remaining paranasal sinuses and mastoid air cells clear Other: N/A IMPRESSION: No acute intracranial abnormalities. Electronically Signed   By: Lavonia Dana M.D.   On: 01/19/2022 14:32    Pertinent labs & imaging results that were available during my care of the patient were reviewed by me and considered in my medical decision making (see MDM for details).  Medications Ordered in ED Medications  acetaminophen (TYLENOL) tablet 1,000 mg (has no administration in time range)  amLODipine (NORVASC) tablet 5 mg (has no administration in time range)  Procedures Procedures  (including critical care time)  Medical Decision Making / ED Course   MDM:  41 year old male presenting to the emergency department with headache  Patient well-appearing, physical exam including full neurologic exam unremarkable.  History most consistent with tension headache. Differential includes tension headache versus migraine headache.  Low concern for dangerous cause of headache such as intracranial mass or bleeding given history and exam, intracranial infectious process such as meningitis or encephalitis without fever or meningismus, or subarachnoid hemorrhage given intermittent nature. Doubt carbon monoxide poisoning, temporal arteritis or glaucoma given history.   Headache could be in the setting of hypertension although no signs of hypertensive emergency without any focal neurologic deficit, vision changes, chest pain, reassuring lab tests.  Will start on amlodipine given significant hypertension, also counseled smoking cessation.  Discussed importance of primary care follow-up, patient reports that he is planning on establishing care with a primary physician. Will discharge patient to home. All questions answered. Patient comfortable with plan of discharge. Return precautions discussed with patient and specified on the after visit summary.        Additional history obtained: -Additional history obtained from family -External records from outside source obtained and reviewed including: Chart review including previous notes, labs, imaging, consultation notes   Lab Tests: -I ordered, reviewed, and interpreted labs.   The pertinent results include:   Labs Reviewed  CBC WITH DIFFERENTIAL/PLATELET  COMPREHENSIVE METABOLIC PANEL      Imaging Studies ordered: I ordered imaging studies including CT brain On my interpretation imaging demonstrates no acute findings I independently visualized and interpreted imaging. I agree with the radiologist interpretation   Medicines ordered and prescription drug management: Meds ordered this encounter  Medications   acetaminophen (TYLENOL) tablet 1,000 mg   amLODipine (NORVASC) tablet 5 mg   amLODipine (NORVASC) 5 MG tablet    Sig: Take 1 tablet (5 mg total) by mouth daily.    Dispense:  60 tablet    Refill:  1    -I have reviewed the patients home medicines and have made adjustments as needed  Social Determinants of Health:  Factors impacting patients care include: current smoker   Reevaluation: After the interventions noted above, I reevaluated the patient and found that they have improved  Co morbidities that complicate the patient evaluation History reviewed. No pertinent past medical history.     Dispostion: Discharge    Final Clinical Impression(s) / ED Diagnoses Final diagnoses:  Hypertension, unspecified type  Nonintractable episodic headache, unspecified headache type  Hx of smoking     This chart was dictated using voice recognition software.  Despite best efforts to proofread,  errors can occur which can change the documentation meaning.    Lonell Grandchild, MD 01/19/22 2030

## 2022-01-19 NOTE — Discharge Instructions (Addendum)
We evaluated you in the emergency department for your headache.  Your testing was reassuring including blood tests and a CT scan of your head.  Your blood pressure was very elevated.  Please quit smoking.  Please also take amlodipine we have prescribed for your blood pressure.  The thing that will help your blood pressure the most is stopping smoking.  Please schedule appointment with your primary doctor.  Please return to the emergency department if you develop any new or worsening symptoms such as vision changes, vomiting, severe pain, chest pain, trouble breathing, abdominal pain, or any other concerning symptoms.

## 2022-01-20 ENCOUNTER — Other Ambulatory Visit (HOSPITAL_BASED_OUTPATIENT_CLINIC_OR_DEPARTMENT_OTHER): Payer: Self-pay

## 2022-05-26 ENCOUNTER — Other Ambulatory Visit: Payer: Self-pay

## 2022-05-26 ENCOUNTER — Emergency Department (HOSPITAL_BASED_OUTPATIENT_CLINIC_OR_DEPARTMENT_OTHER)
Admission: EM | Admit: 2022-05-26 | Discharge: 2022-05-26 | Disposition: A | Discharge status: Home / Self Care | Payer: BC Managed Care – PPO | Attending: Emergency Medicine | Admitting: Emergency Medicine

## 2022-05-26 ENCOUNTER — Encounter (HOSPITAL_BASED_OUTPATIENT_CLINIC_OR_DEPARTMENT_OTHER): Payer: Self-pay | Admitting: Emergency Medicine

## 2022-05-26 DIAGNOSIS — J101 Influenza due to other identified influenza virus with other respiratory manifestations: Secondary | ICD-10-CM | POA: Insufficient documentation

## 2022-05-26 DIAGNOSIS — Z1152 Encounter for screening for COVID-19: Secondary | ICD-10-CM | POA: Insufficient documentation

## 2022-05-26 LAB — RESP PANEL BY RT-PCR (RSV, FLU A&B, COVID)  RVPGX2
Influenza A by PCR: POSITIVE — AB
Influenza B by PCR: NEGATIVE
Resp Syncytial Virus by PCR: NEGATIVE
SARS Coronavirus 2 by RT PCR: NEGATIVE

## 2022-05-26 MED ORDER — OSELTAMIVIR PHOSPHATE 75 MG PO CAPS: 75.0000 mg | ORAL_CAPSULE | Freq: Two times a day (BID) | ORAL | 0 refills | Status: DC

## 2022-05-26 NOTE — ED Triage Notes (Signed)
Cough, chills since yesterday.

## 2022-05-26 NOTE — Discharge Instructions (Signed)
You have the flu this is a viral infection that will likely start to improve after 7-10 days, antibiotics are not helpful in treating viral infections.  You may take Tamiflu twice a day for the next 5 days this will help shorten the duration of the flu and prevent complications, this medication can cause some upset stomach, nausea and diarrhea. Please make sure you are drinking plenty of fluids. You can treat your symptoms supportively with tylenol 650 mg and ibuprofen 600 mg every 6 hours for fevers and pains. For nasal congestion you can use Zyrtec and Flonase to help with nasal congestion. To treat cough you can use over the counter cough medications such as Mucinex DM or Robitussin and throat lozenges. If your symptoms are not improving please follow up with you Primary doctor.   If you develop persistent fevers, shortness of breath or difficulty breathing, chest pain, severe headache and neck pain, persistent nausea and vomiting or other new or concerning symptoms return to the Emergency department.

## 2022-05-26 NOTE — ED Provider Notes (Cosign Needed)
[facility] Provider Note   CSN: [id number] Arrival date & time: 05/26/22  1005     History  Chief Complaint  Patient presents with   Cough    Benjamin Cameron is a 42 y.o. male.  Benjamin Cameron is a 42 y.o. male who is otherwise healthy, presents to the ED for evaluation of cough, congestion, chills and subjective fever.  Symptoms started yesterday.  Patient also reports some associated body aches.  No chest pain or shortness of breath.  No abdominal pain, vomiting or diarrhea.  Unsure of sick contacts.  No meds prior to arrival.  No other aggravating or alleviating factors.  The history is provided by the patient.  Cough Associated symptoms: chills, fever and rhinorrhea        Home Medications Prior to Admission medications   Medication Sig Start Date End Date Taking? Authorizing Provider  amLODipine (NORVASC) 5 MG tablet Take 1 tablet (5 mg total) by mouth daily. 01/19/22   Cristie Hem, MD      Allergies    Patient has no known allergies.    Review of Systems   Review of Systems  Constitutional:  Positive for chills and fever.  HENT:  Positive for congestion and rhinorrhea.   Respiratory:  Positive for cough.     Physical Exam Updated Vital Signs BP (!) 149/108 (BP Location: Right Arm)   Pulse (!) 101   Temp 98.8 F (37.1 C) (Oral)   Resp 20   Ht 6\' 2"  (1.88 m)   Wt 106.6 kg   SpO2 99%   BMI 30.17 kg/m  Physical Exam Vitals and nursing note reviewed.  Constitutional:      General: He is not in acute distress.    Appearance: He is well-developed. He is not ill-appearing or diaphoretic.  HENT:     Head: Normocephalic and atraumatic.     Nose: Rhinorrhea present.     Mouth/Throat:     Mouth: Mucous membranes are moist.     Pharynx: Oropharynx is clear.  Eyes:     General:        Right eye: No discharge.        Left eye: No discharge.  Neck:     Comments: No rigidity Cardiovascular:     Rate and Rhythm: Normal  rate and regular rhythm.     Heart sounds: Normal heart sounds. No murmur heard.    No friction rub. No gallop.  Pulmonary:     Effort: Pulmonary effort is normal. No respiratory distress.     Breath sounds: Normal breath sounds.     Comments: Respirations equal and unlabored, patient able to speak in full sentences, lungs clear to auscultation bilaterally  Abdominal:     General: Bowel sounds are normal. There is no distension.     Palpations: Abdomen is soft. There is no mass.     Tenderness: There is no abdominal tenderness. There is no guarding.     Comments: Abdomen soft, nondistended, nontender to palpation in all quadrants without guarding or peritoneal signs  Musculoskeletal:        General: No deformity.     Cervical back: Neck supple.  Lymphadenopathy:     Cervical: No cervical adenopathy.  Skin:    General: Skin is warm and dry.     Capillary Refill: Capillary refill takes less than 2 seconds.  Neurological:     Mental Status: He is alert and oriented to person, place, and time.  Psychiatric:  Mood and Affect: Mood normal.        Behavior: Behavior normal.     ED Results / Procedures / Treatments   Labs (all labs ordered are listed, but only abnormal results are displayed) Labs Reviewed  RESP PANEL BY RT-PCR (RSV, FLU A&B, COVID)  RVPGX2 - Abnormal; Notable for the following components:      Result Value   Influenza A by PCR POSITIVE (*)    All other components within normal limits    EKG None  Radiology No results found.  Procedures Procedures    Medications Ordered in ED Medications - No data to display  ED Course/ Medical Decision Making/ A&P                           Medical Decision Making Risk Prescription drug management.  Patient with symptoms consistent with influenza.  Vitals are stable.  No signs of dehydration, tolerating PO's.  Consider chest x-ray but symptoms have been present for less than 24 hours patient without tachypnea,  hypoxia and lungs are clear on exam so have low suspicion for pneumonia and do not feel that it would change management.  Patient has tested positive for influenza A, negative for COVID and RSV.  Pt within 48 hour window, discussed cost vs. benefit of tamiflu, pt would like to take tamiflu.   Patient will be discharged with instructions to orally hydrate, rest, and use over-the-counter medications such as anti-inflammatories ibuprofen and Aleve for muscle aches and Tylenol for fever.  At this time there does not appear to be any evidence of an acute emergency medical condition requiring further emergent evaluation and the patient appears stable for discharge with appropriate outpatient follow up. Diagnosis and return precautions discussed with patient who verbalizes understanding and is agreeable to discharge.           Final Clinical Impression(s) / ED Diagnoses Final diagnoses:  Influenza A    Rx / DC Orders ED Discharge Orders          Ordered    oseltamivir (TAMIFLU) 75 MG capsule  Every 12 hours        05/26/22 1422              Jacqlyn Larsen, [place] 06/10/22 1344

## 2023-03-31 ENCOUNTER — Emergency Department (HOSPITAL_COMMUNITY)
Admission: EM | Admit: 2023-03-31 | Discharge: 2023-03-31 | Disposition: A | Discharge status: Home / Self Care | Payer: Self-pay | Attending: Emergency Medicine | Admitting: Emergency Medicine

## 2023-03-31 ENCOUNTER — Other Ambulatory Visit: Payer: Self-pay

## 2023-03-31 ENCOUNTER — Encounter (HOSPITAL_COMMUNITY): Payer: Self-pay | Admitting: *Deleted

## 2023-03-31 ENCOUNTER — Emergency Department (HOSPITAL_COMMUNITY): Payer: Self-pay

## 2023-03-31 DIAGNOSIS — Z79899 Other long term (current) drug therapy: Secondary | ICD-10-CM | POA: Insufficient documentation

## 2023-03-31 DIAGNOSIS — R0789 Other chest pain: Secondary | ICD-10-CM | POA: Insufficient documentation

## 2023-03-31 DIAGNOSIS — R519 Headache, unspecified: Secondary | ICD-10-CM

## 2023-03-31 DIAGNOSIS — I10 Essential (primary) hypertension: Secondary | ICD-10-CM | POA: Insufficient documentation

## 2023-03-31 LAB — CBC WITH DIFFERENTIAL/PLATELET
Abs Immature Granulocytes: 0.01 10*3/uL (ref 0.00–0.07)
Basophils Absolute: 0 10*3/uL (ref 0.0–0.1)
Basophils Relative: 1 %
Eosinophils Absolute: 0.3 10*3/uL (ref 0.0–0.5)
Eosinophils Relative: 4 %
HCT: 42.9 % (ref 39.0–52.0)
Hemoglobin: 14.2 g/dL (ref 13.0–17.0)
Immature Granulocytes: 0 %
Lymphocytes Relative: 23 %
Lymphs Abs: 1.4 10*3/uL (ref 0.7–4.0)
MCH: 30.1 pg (ref 26.0–34.0)
MCHC: 33.1 g/dL (ref 30.0–36.0)
MCV: 91.1 fL (ref 80.0–100.0)
Monocytes Absolute: 0.5 10*3/uL (ref 0.1–1.0)
Monocytes Relative: 9 %
Neutro Abs: 3.7 10*3/uL (ref 1.7–7.7)
Neutrophils Relative %: 63 %
Platelets: 279 10*3/uL (ref 150–400)
RBC: 4.71 MIL/uL (ref 4.22–5.81)
RDW: 13.4 % (ref 11.5–15.5)
WBC: 5.9 10*3/uL (ref 4.0–10.5)
nRBC: 0 % (ref 0.0–0.2)

## 2023-03-31 LAB — COMPREHENSIVE METABOLIC PANEL
ALT: 35 U/L (ref 0–44)
AST: 35 U/L (ref 15–41)
Albumin: 4.4 g/dL (ref 3.5–5.0)
Alkaline Phosphatase: 64 U/L (ref 38–126)
Anion gap: 11 (ref 5–15)
BUN: 9 mg/dL (ref 6–20)
CO2: 26 mmol/L (ref 22–32)
Calcium: 9.5 mg/dL (ref 8.9–10.3)
Chloride: 104 mmol/L (ref 98–111)
Creatinine, Ser: 1.09 mg/dL (ref 0.61–1.24)
GFR, Estimated: >60 mL/min (ref >60)
Glucose, Bld: 94 mg/dL (ref 70–99)
Potassium: 3.5 mmol/L (ref 3.5–5.1)
Sodium: 141 mmol/L (ref 135–145)
Total Bilirubin: 0.9 mg/dL (ref <1.2)
Total Protein: 7.8 g/dL (ref 6.5–8.1)

## 2023-03-31 LAB — I-STAT CHEM 8, ED
BUN: 9 mg/dL (ref 6–20)
Calcium, Ion: 1.14 mmol/L — ABNORMAL LOW (ref 1.15–1.40)
Chloride: 102 mmol/L (ref 98–111)
Creatinine, Ser: 1.1 mg/dL (ref 0.61–1.24)
Glucose, Bld: 92 mg/dL (ref 70–99)
HCT: 45 % (ref 39.0–52.0)
Hemoglobin: 15.3 g/dL (ref 13.0–17.0)
Potassium: 3.4 mmol/L — ABNORMAL LOW (ref 3.5–5.1)
Sodium: 142 mmol/L (ref 135–145)
TCO2: 29 mmol/L (ref 22–32)

## 2023-03-31 LAB — TROPONIN I (HIGH SENSITIVITY)
Troponin I (High Sensitivity): 6 ng/L (ref <18)
Troponin I (High Sensitivity): 6 ng/L (ref <18)

## 2023-03-31 MED ORDER — PROCHLORPERAZINE EDISYLATE 10 MG/2ML IJ SOLN
10.0000 mg | Freq: Once | INTRAMUSCULAR | Status: AC
  Administered 2023-03-31: 10 mg via INTRAVENOUS
  Filled 2023-03-31: qty 2

## 2023-03-31 MED ORDER — SODIUM CHLORIDE 0.9 % IV BOLUS
500.0000 mL | Freq: Once | INTRAVENOUS | Status: AC
  Administered 2023-03-31: 500 mL via INTRAVENOUS

## 2023-03-31 MED ORDER — KETOROLAC TROMETHAMINE 15 MG/ML IJ SOLN
15.0000 mg | Freq: Once | INTRAMUSCULAR | Status: AC
  Administered 2023-03-31: 15 mg via INTRAVENOUS
  Filled 2023-03-31: qty 1

## 2023-03-31 MED ORDER — NITROGLYCERIN 0.4 MG SL SUBL
0.4000 mg | SUBLINGUAL_TABLET | Freq: Once | SUBLINGUAL | Status: AC
  Administered 2023-03-31: 0.4 mg via SUBLINGUAL
  Filled 2023-03-31: qty 1

## 2023-03-31 MED ORDER — DIPHENHYDRAMINE HCL 50 MG/ML IJ SOLN
12.5000 mg | Freq: Once | INTRAMUSCULAR | Status: AC
  Administered 2023-03-31: 12.5 mg via INTRAVENOUS
  Filled 2023-03-31: qty 1

## 2023-03-31 NOTE — ED Triage Notes (Signed)
The pt is c/o chest pain migraine headache and elevated bp for 3 days

## 2023-03-31 NOTE — ED Provider Notes (Signed)
[facility] AT [facility] Provider Note   CSN: [id number] Arrival date & time: 03/31/23  1455     History  Chief Complaint  Patient presents with   Chest Pain    Ulises Woodington is a 42 y.o. male.  42 year old male presents today for concern of elevated blood pressure, headache, and chest pain.  Chest pain has been ongoing for the past 3 days and he describes it as a constant chest tightness.  He states he has chronic uncontrolled elevated blood pressure.  Does not take his medications that he was prescribed.  Does not have a PCP and his medication was previously prescribed through the ED.  No alleviating or aggravating factors for his chest pain.  Endorses some blurry vision earlier but that has significantly improved.  Endorses cardiac history for his dad who had an MI during his 75s.  Sibling with recent CVA.  The history is provided by the patient. No language interpreter was used.       Home Medications Prior to Admission medications   Medication Sig Start Date End Date Taking? Authorizing Provider  amLODipine (NORVASC) 5 MG tablet Take 1 tablet (5 mg total) by mouth daily. 01/19/22   Lonell Grandchild, MD  oseltamivir (TAMIFLU) 75 MG capsule Take 1 capsule (75 mg total) by mouth every 12 (twelve) hours. 05/26/22   Dartha Lodge, PA-C      Allergies    Patient has no known allergies.    Review of Systems   Review of Systems  Constitutional:  Negative for chills and fever.  Respiratory:  Negative for shortness of breath.   Cardiovascular:  Positive for chest pain.  Gastrointestinal:  Negative for abdominal pain, nausea and vomiting.  Genitourinary:  Negative for difficulty urinating.  Neurological:  Negative for light-headedness.  All other systems reviewed and are negative.   Physical Exam Updated Vital Signs BP (!) 150/96   Pulse 71   Temp 98.2 F (36.8 C) (Oral)   Resp 17   Ht 6\' 2"  (1.88 m)   Wt 106.6 kg   SpO2 100%   BMI  30.17 kg/m  Physical Exam Vitals and nursing note reviewed.  Constitutional:      General: He is not in acute distress.    Appearance: Normal appearance. He is not ill-appearing.  HENT:     Head: Normocephalic and atraumatic.     Nose: Nose normal.  Eyes:     General: No scleral icterus.    Extraocular Movements: Extraocular movements intact.     Conjunctiva/sclera: Conjunctivae normal.  Cardiovascular:     Rate and Rhythm: Normal rate and regular rhythm.     Heart sounds: Normal heart sounds.  Pulmonary:     Effort: Pulmonary effort is normal. No respiratory distress.     Breath sounds: Normal breath sounds. No wheezing or rales.  Abdominal:     General: There is no distension.     Tenderness: There is no abdominal tenderness.  Musculoskeletal:        General: Normal range of motion.     Cervical back: Normal range of motion.  Skin:    General: Skin is warm and dry.  Neurological:     General: No focal deficit present.     Mental Status: He is alert and oriented to person, place, and time. Mental status is at baseline.     Cranial Nerves: No cranial nerve deficit.     Motor: No weakness.  ED Results / Procedures / Treatments   Labs (all labs ordered are listed, but only abnormal results are displayed) Labs Reviewed  I-STAT CHEM 8, ED - Abnormal; Notable for the following components:      Result Value   Potassium 3.4 (*)    Calcium, Ion 1.14 (*)    All other components within normal limits  CBC WITH DIFFERENTIAL/PLATELET  COMPREHENSIVE METABOLIC PANEL  TROPONIN I (HIGH SENSITIVITY)  TROPONIN I (HIGH SENSITIVITY)    EKG EKG Interpretation Date/Time:  Friday March 31 2023 15:14:52 EST Ventricular Rate:  81 PR Interval:  162 QRS Duration:  98 QT Interval:  386 QTC Calculation: 448 R Axis:   -15  Text Interpretation: Normal sinus rhythm with sinus arrhythmia Incomplete right bundle branch block ST & T wave abnormality, consider lateral ischemia Abnormal  ECG No significant change since last tracing Confirmed by Elayne Snare ([id number]) on 03/31/2023 4:33:55 PM  Radiology No results found.  Procedures Procedures    Medications Ordered in ED Medications  ketorolac (TORADOL) 15 MG/ML injection 15 mg (has no administration in time range)  prochlorperazine (COMPAZINE) injection 10 mg (has no administration in time range)  nitroGLYCERIN (NITROSTAT) SL tablet 0.4 mg (has no administration in time range)  diphenhydrAMINE (BENADRYL) injection 12.5 mg (has no administration in time range)  sodium chloride 0.9 % bolus 500 mL (has no administration in time range)    ED Course/ Medical Decision Making/ A&P                                 Medical Decision Making Risk Prescription drug management.   Medical Decision Making / ED Course   This patient presents to the ED for concern of chest pain, this involves an extensive number of treatment options, and is a complaint that carries with it a high risk of complications and morbidity.  The differential diagnosis includes ACS, PE, pneumonia, MSK etiology, GERD  MDM: 43 year old male presents today for concern of elevated blood pressure, headache, chest pain.  Has history of migraine headaches.  Will obtain ACS workup and provide migraine cocktail.  CT head was ordered through triage.  He states he has plenty of blood pressure medication at home and has a PCP appointment scheduled for Monday.  Neurological exam without focal deficits.  CBC is unremarkable.  CMP is unremarkable.  Troponin negative x 2.  CT head without acute intracranial process.  Chest x-ray without acute cardiopulmonary process.  EKG without acute ischemic change.  Does show some ST changes however not significantly different from previous.  Will give referral to cardiology given the abnormal EKG as well as family history and chest pain.  Patient agreeable.  Also discussed admission but patient defers and would like outpatient  follow-up.  Discharged in stable condition.  Return precaution discussed.  Patient voices understanding and is in agreement with plan.   Lab Tests: -I ordered, reviewed, and interpreted labs.   The pertinent results include:   Labs Reviewed  I-STAT CHEM 8, ED - Abnormal; Notable for the following components:      Result Value   Potassium 3.4 (*)    Calcium, Ion 1.14 (*)    All other components within normal limits  CBC WITH DIFFERENTIAL/PLATELET  COMPREHENSIVE METABOLIC PANEL  TROPONIN I (HIGH SENSITIVITY)  TROPONIN I (HIGH SENSITIVITY)      EKG  EKG Interpretation Date/Time:  Friday March 31 2023 15:14:52 EST Ventricular Rate:  81 PR Interval:  162 QRS Duration:  98 QT Interval:  386 QTC Calculation: 448 R Axis:   -15  Text Interpretation: Normal sinus rhythm with sinus arrhythmia Incomplete right bundle branch block ST & T wave abnormality, consider lateral ischemia Abnormal ECG No significant change since last tracing Confirmed by Elayne Snare ([id number]) on 03/31/2023 4:33:55 PM         Imaging Studies ordered: I ordered imaging studies including cxr I independently visualized and interpreted imaging. I agree with the radiologist interpretation   Medicines ordered and prescription drug management: Meds ordered this encounter  Medications   ketorolac (TORADOL) 15 MG/ML injection 15 mg   prochlorperazine (COMPAZINE) injection 10 mg   nitroGLYCERIN (NITROSTAT) SL tablet 0.4 mg   diphenhydrAMINE (BENADRYL) injection 12.5 mg   sodium chloride 0.9 % bolus 500 mL    -I have reviewed the patients home medicines and have made adjustments as needed   Cardiac Monitoring: The patient was maintained on a cardiac monitor.  I personally viewed and interpreted the cardiac monitored which showed an underlying rhythm of: NSR   Reevaluation: After the interventions noted above, I reevaluated the patient and found that they have :improved  Co morbidities that complicate  the patient evaluation History reviewed. No pertinent past medical history.    Dispostion: Patient discharged in stable condition.  Return precaution discussed.  Patient voices understanding and is in agreement with plan.   Final Clinical Impression(s) / ED Diagnoses Final diagnoses:  Atypical chest pain  Bad headache  Uncontrolled hypertension    Rx / DC Orders ED Discharge Orders          Ordered    Ambulatory referral to Cardiology       Comments: If you have not heard from the Cardiology office within the next 72 hours please call [phone].   03/31/23 2032              Marita Kansas, PA-C 03/31/23 2032    Rexford Maus, DO 03/31/23 2301

## 2023-03-31 NOTE — ED Notes (Signed)
Dr Dalene Seltzer saw this pt in triage mse not checked off

## 2023-03-31 NOTE — ED Provider Notes (Signed)
MSE orders placed. Discussed ECG with Dr. [place].

## 2023-03-31 NOTE — Discharge Instructions (Addendum)
Your workup was reassuring.  No evidence of heart attack.  We discussed your EKG was somewhat abnormal.  Have given you referral to cardiology.  For any concerning symptoms return to the emergency room.

## 2023-04-06 ENCOUNTER — Ambulatory Visit: Payer: BC Managed Care – PPO | Attending: Cardiology | Admitting: Cardiology

## 2023-04-06 ENCOUNTER — Encounter: Payer: Self-pay | Admitting: Cardiology

## 2023-04-06 ENCOUNTER — Ambulatory Visit: Payer: BC Managed Care – PPO | Admitting: Cardiology

## 2023-04-06 VITALS — BP 154/96 | HR 75 | Ht 75.0 in | Wt 236.6 lb

## 2023-04-06 DIAGNOSIS — F17210 Nicotine dependence, cigarettes, uncomplicated: Secondary | ICD-10-CM

## 2023-04-06 DIAGNOSIS — I10 Essential (primary) hypertension: Secondary | ICD-10-CM | POA: Insufficient documentation

## 2023-04-06 DIAGNOSIS — R079 Chest pain, unspecified: Secondary | ICD-10-CM | POA: Diagnosis not present

## 2023-04-06 DIAGNOSIS — R072 Precordial pain: Secondary | ICD-10-CM

## 2023-04-06 DIAGNOSIS — R0789 Other chest pain: Secondary | ICD-10-CM

## 2023-04-06 MED ORDER — NITROGLYCERIN 0.4 MG SL SUBL: 0.4000 mg | SUBLINGUAL_TABLET | SUBLINGUAL | 3 refills | Status: AC | PRN

## 2023-04-06 NOTE — Patient Instructions (Addendum)
Medication Instructions:  Your physician recommends that you continue on your current medications as directed. Please refer to the Current Medication list given to you today.  *If you need a refill on your cardiac medications before your next appointment, please call your pharmacy*   Lab Work: NONE If you have labs (blood work) drawn today and your tests are completely normal, you will receive your results only by: MyChart Message (if you have MyChart) OR A paper copy in the mail If you have any lab test that is abnormal or we need to change your treatment, we will call you to review the results.   Testing/Procedures: Your physician has requested that you have an echocardiogram. Echocardiography is a painless test that uses sound waves to create images of your heart. It provides your doctor with information about the size and shape of your heart and how well your heart's chambers and valves are working. This procedure takes approximately one hour. There are no restrictions for this procedure. Please do NOT wear cologne, perfume, aftershave, or lotions (deodorant is allowed). Please arrive 15 minutes prior to your appointment time.  Please note: We ask at that you not bring children with you during ultrasound (echo/ vascular) testing. Due to room size and safety concerns, children are not allowed in the ultrasound rooms during exams. Our front office staff cannot provide observation of children in our lobby area while testing is being conducted. An adult accompanying a patient to their appointment will only be allowed in the ultrasound room at the discretion of the ultrasound technician under special circumstances. We apologize for any inconvenience.   Your physician has requested that you have a lexiscan myoview. For further information please visit [email or web address] Please follow instruction sheet, as given.   The test will take approximately 3 to 4 hours to complete; you may bring  reading material.  If someone comes with you to your appointment, they will need to remain in the main lobby due to limited space in the testing area. How to prepare for your Myocardial Perfusion Test:             Do not take Erectile Dysfunction medication 48 hours prior to test. Do not eat or drink 3 hours prior to your test, except you may have water. Do not consume products containing caffeine (regular or decaffeinated) 12 hours prior to your test. (ex: coffee, chocolate, sodas, tea). Do bring a list of your current medications with you.  If not listed below, you may take your medications as normal. Do wear comfortable clothes (no dresses or overalls) and walking shoes, tennis shoes preferred (No heels or open toe shoes are allowed). Do NOT wear cologne, perfume, aftershave, or lotions (deodorant is allowed). If these instructions are not followed, your test will have to be rescheduled.    Follow-Up: At [facility], you and your health needs are our priority.  As part of our continuing mission to provide you with exceptional heart care, we have created designated Provider Care Teams.  These Care Teams include your primary Cardiologist (physician) and Advanced Practice Providers (APPs -  Physician Assistants and Nurse Practitioners) who all work together to provide you with the care you need, when you need it.  We recommend signing up for the patient portal called "MyChart".  Sign up information is provided on this After Visit Summary.  MyChart is used to connect with patients for Virtual Visits (Telemedicine).  Patients are able to view lab/test results, encounter notes, upcoming appointments,  etc.  Non-urgent messages can be sent to your provider as well.   To learn more about what you can do with MyChart, go to [email or web address].    Your next appointment:  As needed, if symptoms worsen or fail to improve    Provider:   Belva Crome, MD    Other Instructions

## 2023-04-06 NOTE — Addendum Note (Signed)
Addended by: Roosvelt Harps R on: 04/06/2023 04:39 PM   Modules accepted: Orders

## 2023-04-06 NOTE — Progress Notes (Signed)
Cardiology Office Note:    Date:  04/06/2023   ID:  Benjamin Cameron, DOB Mar 31, 1981, MRN [id number]  PCP:  Pcp, No  Cardiologist:  Garwin Brothers, MD   Referring MD: Marita Kansas, PA-C    ASSESSMENT:    1. Atypical chest pain   2. Chest pain, unspecified type   3. Cigarette smoker   4. Essential hypertension    PLAN:    In order of problems listed above:  Primary prevention stressed with the patient.  Importance of compliance with diet medication stressed and patient verbalized standing. Chest pain: Atypical in nature: I discussed this with him.  He does have nitroglycerin sublingual and knows the protocol.  To evaluate him I will do an exercise stress Cardiolite.  He is agreeable.  In the interim I have told him not to stress himself with excessive physical activity or mental stress and is agreeable. Cardiac murmur: Echocardiogram will be done to assess murmur heard on auscultation. Essential hypertension: Blood pressure stable.  Salt intake and diet emphasized.  He vocalized understanding and promises to do better. Patient will be seen in follow-up appointment in 6 months or earlier if the patient has any concerns.    Medication Adjustments/Labs and Tests Ordered: Current medicines are reviewed at length with the patient today.  Concerns regarding medicines are outlined above.  Orders Placed This Encounter  Procedures   EKG 12-Lead   No orders of the defined types were placed in this encounter.    History of Present Illness:    Benjamin Cameron is a 42 y.o. male who is being seen today for the evaluation of chest pain at the request of Marita Kansas, [place].  Patient is a pleasant 42 year old male.  He has past medical history of essential hypertension.  He mentions to me that occasionally he will have chest tightness.  This is not necessarily involved with activity.  No orthopnea or PND.  No radiation to the neck or to the arms.  He went to the emergency room and was  evaluated for this.  His blood pressure medication was doubled.  He is here for follow-up for this.  At the time of my evaluation, the patient is alert awake oriented and in no distress.  History reviewed. No pertinent past medical history.  Past Surgical History:  Procedure Laterality Date   NO PAST SURGERIES      Current Medications: Current Meds  Medication Sig   amLODipine (NORVASC) 10 MG tablet Take 10 mg by mouth daily.     Allergies:   Patient has no known allergies.   Social History   Socioeconomic History   Marital status: Single    Spouse name: Not on file   Number of children: Not on file   Years of education: Not on file   Highest education level: Not on file  Occupational History   Not on file  Tobacco Use   Smoking status: Every Day    Current packs/day: 0.50    Types: Cigarettes   Smokeless tobacco: Never  Substance and Sexual Activity   Alcohol use: Yes    Comment: occ   Drug use: No   Sexual activity: Not on file  Other Topics Concern   Not on file  Social History Narrative   Not on file   Social Determinants of Health   Financial Resource Strain: Not on file  Food Insecurity: Not on file  Transportation Needs: Not on file  Physical Activity: Not on file  Stress:  Not on file  Social Connections: Not on file     Family History: The patient's family history includes Cancer in his mother; Diabetes in his paternal grandfather and paternal grandmother; Heart attack in his father; Hypertension in his brother, father, mother, paternal grandfather, and paternal grandmother.  ROS:   Please see the history of present illness.    All other systems reviewed and are negative.  EKGs/Labs/Other Studies Reviewed:    The following studies were reviewed today:  EKG Interpretation Date/Time:  Thursday April 06 2023 17:17:30 EST Ventricular Rate:  75 PR Interval:  170 QRS Duration:  96 QT Interval:  384 QTC Calculation: 428 R Axis:   -8  Text  Interpretation: Normal sinus rhythm Minimal voltage criteria for LVH, may be normal variant Borderline ECG When compared with ECG of 31-Mar-2023 15:14, ST no longer elevated in Inferior leads ST elevation now present in Lateral leads T wave inversion now evident in Inferior leads T wave inversion no longer evident in Lateral leads Confirmed by Belva Crome [phone]) on 04/06/2023 4:21:17 PM     Recent Labs: 03/31/2023: ALT 35; BUN 9; Creatinine, Ser 1.10; Hemoglobin 15.3; Platelets 279; Potassium 3.4; Sodium 142  Recent Lipid Panel No results found for: "CHOL", "TRIG", "HDL", "CHOLHDL", "VLDL", "LDLCALC", "LDLDIRECT"  Physical Exam:    VS:  BP (!) 154/96   Pulse 75   Ht 6\' 3"  (1.905 m)   Wt 236 lb 9.6 oz (107.3 kg)   SpO2 98%   BMI 29.57 kg/m     Wt Readings from Last 3 Encounters:  04/06/23 236 lb 9.6 oz (107.3 kg)  03/31/23 235 lb 0.2 oz (106.6 kg)  05/26/22 235 lb (106.6 kg)     GEN: Patient is in no acute distress HEENT: Normal NECK: No JVD; No carotid bruits LYMPHATICS: No lymphadenopathy CARDIAC: S1 S2 regular, 2/6 systolic murmur at the apex. RESPIRATORY:  Clear to auscultation without rales, wheezing or rhonchi  ABDOMEN: Soft, non-tender, non-distended MUSCULOSKELETAL:  No edema; No deformity  SKIN: Warm and dry NEUROLOGIC:  Alert and oriented x 3 PSYCHIATRIC:  Normal affect    Signed, Garwin Brothers, MD  04/06/2023 4:28 PM    [facility]

## 2023-04-07 ENCOUNTER — Telehealth (HOSPITAL_COMMUNITY): Payer: Self-pay | Admitting: *Deleted

## 2023-04-07 NOTE — Telephone Encounter (Signed)
Patient given detailed instructions per Myocardial Perfusion Study Information Sheet for the test on 04/11/2023 at 7:30. Patient notified to arrive 15 minutes early and that it is imperative to arrive on time for appointment to keep from having the test rescheduled.  If you need to cancel or reschedule your appointment, please call the office within 24 hours of your appointment. . Patient verbalized understanding.Nelson Chimes S \

## 2023-04-10 ENCOUNTER — Telehealth: Payer: Self-pay | Admitting: Cardiology

## 2023-04-10 NOTE — Telephone Encounter (Signed)
Patient is calling with questions in regards to his upcomming appointment. Please advise.

## 2023-04-10 NOTE — Telephone Encounter (Signed)
Attempted to call patient/voicemail not set up/kbl 04/10/23

## 2023-04-11 ENCOUNTER — Ambulatory Visit (HOSPITAL_COMMUNITY): Payer: BC Managed Care – PPO

## 2023-04-14 ENCOUNTER — Encounter (HOSPITAL_COMMUNITY): Payer: Self-pay

## 2023-04-14 ENCOUNTER — Ambulatory Visit: Payer: BC Managed Care – PPO

## 2023-04-14 NOTE — Telephone Encounter (Signed)
Called and talked to patient. Had to reschedule his echo because of a tech being out. All questions answered satisfactory. Patient will reach out if any more arise. 04/13/23 KBL

## 2023-04-18 ENCOUNTER — Ambulatory Visit (HOSPITAL_COMMUNITY): Payer: BC Managed Care – PPO | Attending: Cardiology

## 2023-04-18 DIAGNOSIS — I10 Essential (primary) hypertension: Secondary | ICD-10-CM | POA: Insufficient documentation

## 2023-04-18 DIAGNOSIS — F17210 Nicotine dependence, cigarettes, uncomplicated: Secondary | ICD-10-CM | POA: Diagnosis present

## 2023-04-18 DIAGNOSIS — R0789 Other chest pain: Secondary | ICD-10-CM | POA: Insufficient documentation

## 2023-04-18 DIAGNOSIS — R072 Precordial pain: Secondary | ICD-10-CM | POA: Insufficient documentation

## 2023-04-18 LAB — MYOCARDIAL PERFUSION IMAGING
Angina Index: 0
Duke Treadmill Score: 10
Estimated workload: 10.9
Exercise duration (min): 9 min
Exercise duration (sec): 30 s
LV dias vol: 135 mL (ref 62–150)
LV sys vol: 63 mL
MPHR: 178 {beats}/min
Nuc Stress EF: 53 %
Peak HR: 160 {beats}/min
Percent HR: 89 %
Rest HR: 64 {beats}/min
Rest Nuclear Isotope Dose: 10.3 mCi
SDS: 0
SRS: 0
SSS: 0
ST Depression (mm): 0 mm
Stress Nuclear Isotope Dose: 32.4 mCi
TID: 0.88

## 2023-04-18 MED ORDER — TECHNETIUM TC 99M TETROFOSMIN IV KIT
10.3000 | PACK | Freq: Once | INTRAVENOUS | Status: AC | PRN
  Administered 2023-04-18: 10.3 via INTRAVENOUS

## 2023-04-18 MED ORDER — TECHNETIUM TC 99M TETROFOSMIN IV KIT
32.4000 | PACK | Freq: Once | INTRAVENOUS | Status: AC | PRN
  Administered 2023-04-18: 32.4 via INTRAVENOUS

## 2023-04-19 ENCOUNTER — Ambulatory Visit: Payer: Self-pay | Admitting: Cardiology

## 2023-05-08 ENCOUNTER — Ambulatory Visit: Payer: BC Managed Care – PPO

## 2023-05-31 ENCOUNTER — Encounter: Payer: Self-pay | Admitting: Cardiology
